# Patient Record
Sex: Male | Born: 1935 | Race: White | Hispanic: No | Smoking: Former smoker
Health system: Southern US, Community
[De-identification: ages and names within clinical notes are randomized; demographics above are authoritative.]

## PROBLEM LIST (undated history)

## (undated) DIAGNOSIS — I4891 Unspecified atrial fibrillation: Secondary | ICD-10-CM

## (undated) DIAGNOSIS — I4892 Unspecified atrial flutter: Secondary | ICD-10-CM

## (undated) DIAGNOSIS — C4490 Unspecified malignant neoplasm of skin, unspecified: Secondary | ICD-10-CM

## (undated) DIAGNOSIS — K219 Gastro-esophageal reflux disease without esophagitis: Secondary | ICD-10-CM

## (undated) DIAGNOSIS — M069 Rheumatoid arthritis, unspecified: Secondary | ICD-10-CM

## (undated) DIAGNOSIS — D509 Iron deficiency anemia, unspecified: Secondary | ICD-10-CM

## (undated) DIAGNOSIS — Z973 Presence of spectacles and contact lenses: Secondary | ICD-10-CM

## (undated) DIAGNOSIS — E039 Hypothyroidism, unspecified: Secondary | ICD-10-CM

## (undated) HISTORY — PX: APPENDECTOMY: SHX54

## (undated) HISTORY — DX: Hypothyroidism, unspecified: E03.9

## (undated) HISTORY — PX: COLON RESECTION: SHX5231

## (undated) HISTORY — DX: Gastro-esophageal reflux disease without esophagitis: K21.9

## (undated) HISTORY — DX: Rheumatoid arthritis, unspecified: M06.9

## (undated) HISTORY — PX: HIP SURGERY: SHX245

## (undated) HISTORY — PX: CHOLECYSTECTOMY: SHX55

## (undated) HISTORY — DX: Unspecified atrial fibrillation: I48.91

## (undated) HISTORY — DX: Unspecified atrial flutter: I48.92

## (undated) HISTORY — PX: LUMBAR DISC SURGERY: SHX700

## (undated) HISTORY — PX: SKIN SURGERY: SHX2413

## (undated) HISTORY — PX: TOTAL SHOULDER REPLACEMENT: SUR1217

---

## 2006-03-20 ENCOUNTER — Encounter: Admission: RE | Admit: 2006-03-20 | Discharge: 2006-03-20 | Payer: Self-pay

## 2006-05-04 ENCOUNTER — Encounter: Admission: RE | Admit: 2006-05-04 | Discharge: 2006-05-04 | Payer: Self-pay

## 2006-06-08 ENCOUNTER — Inpatient Hospital Stay (HOSPITAL_COMMUNITY): Admission: RE | Admit: 2006-06-08 | Discharge: 2006-06-09 | Payer: Self-pay | Admitting: Neurosurgery

## 2007-06-01 ENCOUNTER — Encounter: Admission: RE | Admit: 2007-06-01 | Discharge: 2007-06-01 | Payer: Self-pay | Admitting: Unknown Physician Specialty

## 2007-09-20 ENCOUNTER — Encounter: Payer: Self-pay | Admitting: Cardiology

## 2007-11-01 ENCOUNTER — Encounter: Payer: Self-pay | Admitting: Cardiology

## 2007-11-03 ENCOUNTER — Ambulatory Visit: Payer: Self-pay | Admitting: Cardiology

## 2007-11-04 ENCOUNTER — Encounter: Payer: Self-pay | Admitting: Cardiology

## 2007-11-22 ENCOUNTER — Ambulatory Visit: Payer: Self-pay | Admitting: Cardiology

## 2007-11-23 ENCOUNTER — Encounter: Payer: Self-pay | Admitting: Cardiology

## 2007-12-22 ENCOUNTER — Ambulatory Visit: Payer: Self-pay | Admitting: Internal Medicine

## 2008-01-06 ENCOUNTER — Encounter: Payer: Self-pay | Admitting: Internal Medicine

## 2008-01-12 ENCOUNTER — Ambulatory Visit (HOSPITAL_COMMUNITY): Admission: RE | Admit: 2008-01-12 | Discharge: 2008-01-13 | Payer: Self-pay | Admitting: Internal Medicine

## 2008-01-12 ENCOUNTER — Ambulatory Visit: Payer: Self-pay | Admitting: Internal Medicine

## 2008-02-07 ENCOUNTER — Ambulatory Visit: Payer: Self-pay | Admitting: Cardiology

## 2008-09-27 DIAGNOSIS — I4892 Unspecified atrial flutter: Secondary | ICD-10-CM

## 2008-09-28 ENCOUNTER — Ambulatory Visit: Payer: Self-pay | Admitting: Cardiology

## 2009-03-05 ENCOUNTER — Telehealth (INDEPENDENT_AMBULATORY_CARE_PROVIDER_SITE_OTHER): Payer: Self-pay | Admitting: *Deleted

## 2009-03-06 DIAGNOSIS — R002 Palpitations: Secondary | ICD-10-CM

## 2009-03-23 ENCOUNTER — Telehealth (INDEPENDENT_AMBULATORY_CARE_PROVIDER_SITE_OTHER): Payer: Self-pay | Admitting: *Deleted

## 2009-03-23 ENCOUNTER — Encounter: Payer: Self-pay | Admitting: Physician Assistant

## 2009-03-23 ENCOUNTER — Encounter: Payer: Self-pay | Admitting: Cardiology

## 2009-04-12 ENCOUNTER — Encounter: Payer: Self-pay | Admitting: Cardiology

## 2009-04-19 ENCOUNTER — Ambulatory Visit: Payer: Self-pay | Admitting: Cardiology

## 2009-04-19 DIAGNOSIS — I4891 Unspecified atrial fibrillation: Secondary | ICD-10-CM | POA: Insufficient documentation

## 2009-04-25 ENCOUNTER — Encounter (INDEPENDENT_AMBULATORY_CARE_PROVIDER_SITE_OTHER): Payer: Self-pay | Admitting: *Deleted

## 2009-04-26 ENCOUNTER — Encounter: Payer: Self-pay | Admitting: Cardiology

## 2009-06-11 ENCOUNTER — Encounter: Payer: Self-pay | Admitting: Cardiology

## 2009-06-25 ENCOUNTER — Ambulatory Visit (HOSPITAL_COMMUNITY): Admission: RE | Admit: 2009-06-25 | Discharge: 2009-06-25 | Payer: Self-pay | Admitting: Orthopedic Surgery

## 2009-06-25 ENCOUNTER — Encounter: Payer: Self-pay | Admitting: Cardiology

## 2009-06-28 ENCOUNTER — Encounter: Payer: Self-pay | Admitting: Cardiology

## 2009-06-29 ENCOUNTER — Encounter: Payer: Self-pay | Admitting: Cardiology

## 2009-07-12 ENCOUNTER — Encounter: Payer: Self-pay | Admitting: Cardiology

## 2009-08-10 ENCOUNTER — Encounter: Payer: Self-pay | Admitting: Cardiology

## 2009-08-24 ENCOUNTER — Encounter: Payer: Self-pay | Admitting: Cardiology

## 2009-09-20 ENCOUNTER — Ambulatory Visit: Payer: Self-pay | Admitting: Cardiology

## 2009-10-04 ENCOUNTER — Inpatient Hospital Stay (HOSPITAL_COMMUNITY): Admission: RE | Admit: 2009-10-04 | Discharge: 2009-10-05 | Payer: Self-pay | Admitting: Orthopedic Surgery

## 2010-01-29 ENCOUNTER — Encounter: Payer: Self-pay | Admitting: Cardiology

## 2010-05-07 NOTE — Letter (Signed)
Summary: Engineer, materials at [facility]  [address]   [place], [place] [address]   Phone: [phone]  Fax: [phone]        April 25, 2009 MRN: [id number]    [facility] [address] [place], [place]  [address]    Dear Mr. CRONKRIGHT,  Your test ordered by Selena Batten has been reviewed by your physician (or physician assistant) and was found to be stable. Your physician (or physician assistant) felt no changes were needed at this time.  ____ Echocardiogram  ____ Cardiac Stress Test  ____ Lab Work  ____ Peripheral vascular study of arms, legs or neck  ____ CT scan or X-ray  ____ Lung or Breathing test  __X__ Other: Cardionet Monitor (Heart Monitor)   Thank you.   Cyril Loosen, RN, BSN    Duane Boston, M.D., F.A.C.C. Thressa Sheller, M.D., F.A.C.C. Oneal Grout, M.D., F.A.C.C. Cheree Ditto, M.D., F.A.C.C. Daiva Nakayama, M.D., F.A.C.C. Kenney Houseman, M.D., F.A.C.C. Jeanne Ivan, PA-C

## 2010-05-07 NOTE — Letter (Signed)
Summary: External Correspondence/ OFFICE VISIT DR. DANIEL  External Correspondence/ OFFICE VISIT DR. DANIEL   Imported By: Dorise Hiss 02/05/2010 08:28:20  _____________________________________________________________________  External Attachment:    Type:   Image     Comment:   External Document

## 2010-05-07 NOTE — Letter (Signed)
Summary: External Correspondence/ OFFICE VISIT [facility]  External Correspondence/ OFFICE VISIT [facility]   Imported By: Dorise Hiss 08/29/2009 12:30:58  _____________________________________________________________________  External Attachment:    Type:   Image     Comment:   External Document

## 2010-05-07 NOTE — Procedures (Signed)
Summary: Cardionet Monitor-Final Summary  Cardionet Monitor-Final Summary   Imported By: Cyril Loosen, RN, BSN 04/25/2009 17:01:42  _____________________________________________________________________  External Attachment:    Type:   Image     Comment:   External Document  Appended Document: Cardionet Monitor-Final Summary Pt notified of results by letter.

## 2010-05-07 NOTE — Assessment & Plan Note (Signed)
Summary: EST-PT IRREGULAR HEARTBEAT   Visit Type:  Follow-up Primary Provider:  Dr. Donzetta Sprung   History of Present Illness: 76 year old male presents for a followup visit. He called complaining of recurrent palpitations and was placed on metoprolol 25 mg twice daily. We also provided a CardioNet monitor to better document objectively if he was having definitive rhythm changes in light of his previous ablation. This monitor did reveal evidence of recurrent atrial flutter with some rapid paroxysmal atrial fibrillation as well. These were self-limited.  Dennis Adams indicates feeling much better on metoprolol. I reviewed the monitor strips with him. At this point he is most comfortable with the present regimen. In the past he was on amiodarone. His CHADS2 score is 0-1.    Current Medications (verified): 1)  Metoprolol Tartrate 25 Mg Tabs (Metoprolol Tartrate) .... Take 1 Tablet By Mouth Two Times A Day 2)  Aspirin 325 Mg Tabs (Aspirin) .... Take 1 Tablet By Mouth Once A Day 3)  Calcium 500 Mg Tabs (Calcium Carbonate) .... Take 4 Tablet By Mouth Once A Day 4)  Centrum Silver  Tabs (Multiple Vitamins-Minerals) .... Take 1 Tablet By Mouth Once A Day 5)  Red Yeast Rice 600 Mg Tabs (Red Yeast Rice Extract) .... Take 1 Tablet By Mouth Once A Day 6)  Folic Acid 800 Mcg Tabs (Folic Acid) .... Take 1 Tablet By Mouth Once A Day 7)  Vitamin B-12 1000 Mcg Tabs (Cyanocobalamin) .... Take 1 Tablet By Mouth Once A Day 8)  Glucosamine 500 Mg Caps (Glucosamine Sulfate) .... Take 1 Tablet By Mouth Once A Day 9)  Prednisone 5 Mg Tabs (Prednisone) .... Take 1&1/2 Tablet By Mouth Once A Day 10)  Methotrexate 2.5 Mg Tabs (Methotrexate Sodium) .... Use As Directed 11)  Levothroid 25 Mcg Tabs (Levothyroxine Sodium) .... Take 1 Tablet By Mouth Once A Day 12)  Fish Oil 1200 Mg Caps (Omega-3 Fatty Acids) .... Take 2 Tablet By Mouth Once A Day 13)  Hydrocodone-Acetaminophen 5-500 Mg Tabs (Hydrocodone-Acetaminophen)  .... As Needed 14)  Leflunomide 10 Mg Tabs (Leflunomide) .... Take 1 Tablet By Mouth Once A Day  Allergies (verified): 1)  ! Celebrex  Comments:  Nurse/Medical Assistant: The patient's medications and allergies were reviewed with the patient and were updated in the Medication and Allergy Lists. Stated that meds are still the same and vebally gave names of added meds that were updated today.  Past History:  Past Medical History: Last updated: 04/18/2009 Rheumatoid arthritis G E R D Hypothyroidism Atrial Flutter - RFA 10/09 (Dr. Graciela Husbands)  Social History: Last updated: 04/18/2009 Retired  Married  Tobacco Use - No Alcohol Use - no Drug Use - no  Review of Systems  The patient denies anorexia, fever, chest pain, syncope, peripheral edema, prolonged cough, headaches, melena, and hematochezia.         Otherwise reviewed and negative.  Vital Signs:  Patient profile:   77 year old male Height:      73 inches Weight:      178 pounds BMI:     23.57 Pulse rate:   71 / minute BP sitting:   123 / 74  (left arm) Cuff size:   regular  Vitals Entered By: Dennis Adams (April 19, 2009 10:03 AM)  Physical Exam  Additional Exam:  Normally nourished appearing male in no acute distress. HEENT: Conjunctiva and lids normal, oropharynx clear. Neck: Supple, no elevated venous pressure or bruits. Lungs: Clear to auscultation, nonlabored. Cardiac: Regular rate and rhythm,  2/6 systolic murmur at the right base. No S3. Abdomen: Soft, nontender, no bruits. Extremities: No pitting edema, distal pulses 2+. Skin: Warm and dry. Muscular skeletal: No kyphosis. Neuropsychiatric: Alert and oriented x3, affect appropriate.   EKG  Procedure date:  04/19/2009  Findings:      Normal sinus rhythm at 65 beats per minute.  Impression & Recommendations:  Problem # 1:  ATRIAL FLUTTER (ICD-427.32)  Recurrent palpitations status post previous radiofrequency ablation by Dr. Graciela Husbands. Cardionet  monitor does document recurrent episodes of atrial flutter, self-limited. Addition of metoprolol has provided much better symptom control, and the patient is stable at this point. His CHADS2 score is 0-1, and aspirin will be continued. I will see him back in 6 months.  His updated medication list for this problem includes:    Metoprolol Tartrate 25 Mg Tabs (Metoprolol tartrate) .Marland Kitchen... Take 1 tablet by mouth two times a day    Aspirin 325 Mg Tabs (Aspirin) .Marland Kitchen... Take 1 tablet by mouth once a day  Orders: EKG w/ Interpretation (93000)  Problem # 2:  ATRIAL FIBRILLATION (ICD-427.31)  Brief episodes of atrial fibrillation also noted. Plan as above.  His updated medication list for this problem includes:    Metoprolol Tartrate 25 Mg Tabs (Metoprolol tartrate) .Marland Kitchen... Take 1 tablet by mouth two times a day    Aspirin 325 Mg Tabs (Aspirin) .Marland Kitchen... Take 1 tablet by mouth once a day  Patient Instructions: 1)  Your physician wants you to follow-up in: 6 months. You will receive a reminder letter in the mail one-two months in advance. If you don't receive a letter, please call our office to schedule the follow-up appointment. 2)  Your physician recommends that you continue on your current medications as directed. Please refer to the Current Medication list given to you today.

## 2010-05-07 NOTE — Assessment & Plan Note (Signed)
Summary: 1 YR FU -RECV REMINDER VS   Visit Type:  Follow-up Primary Provider:  Dr. Donzetta Sprung   History of Present Illness: 76 year old male presents for followup. He reports no problems with palpitations, tolerating metoprolol quite well. He denies any exertional chest pain or unusual shortness of breath.  He states that he is nearing time for left shoulder replacement surgery later this month. This has been delayed related to an anemia workup discussed below.  He saw Dr. Reuel Boom in May with findings of iron deficiency anemia, also being followed by Dr. Karilyn Cota.  Hemoglobin was 10 on 6 May with iron 25. He reportedly underwent upper and lower endoscopy as well as a capsule study without obvious findings of acute bleeding source. He was treated with packed red cells and iron supplements, reporting improvement in his hemoglobin to "11.5."   Preventive Screening-Counseling & Management  Alcohol-Tobacco     Smoking Status: quit     Year Quit: 1968     Cans of tobacco/week: chew 1pk tobacco/wk   Current Medications (verified): 1)  Metoprolol Tartrate 25 Mg Tabs (Metoprolol Tartrate) .... Take 1 Tablet By Mouth Two Times A Day 2)  Calcium 500 Mg Tabs (Calcium Carbonate) .... Take 2 Tablet By Mouth Once A Day 3)  Centrum Silver  Tabs (Multiple Vitamins-Minerals) .... Take 1 Tablet By Mouth Once A Day 4)  Red Yeast Rice 600 Mg Tabs (Red Yeast Rice Extract) .... Take 1 Tablet By Mouth Once A Day 5)  Folic Acid 800 Mcg Tabs (Folic Acid) .... Take 1 Tablet By Mouth Once A Day 6)  Vitamin B-12 1000 Mcg Tabs (Cyanocobalamin) .... Take 1 Tablet By Mouth Once A Day 7)  Glucosamine 500 Mg Caps (Glucosamine Sulfate) .... Take 1 Tablet By Mouth Once A Day 8)  Prednisone 5 Mg Tabs (Prednisone) .... Take 1&1/2 Tablet By Mouth Once A Day 9)  Methotrexate 2.5 Mg Tabs (Methotrexate Sodium) .... Use As Directed 10)  Levothyroxine Sodium 50 Mcg Tabs (Levothyroxine Sodium) .... Take 1 Tablet By Mouth Once A  Day 11)  Fish Oil 1200 Mg Caps (Omega-3 Fatty Acids) .... Take 2 Tablet By Mouth Once A Day 12)  Hydrocodone-Acetaminophen 5-500 Mg Tabs (Hydrocodone-Acetaminophen) .... As Needed 13)  Leflunomide 10 Mg Tabs (Leflunomide) .... Take 1 Tablet By Mouth Once A Day 14)  Nu-Iron 150 Mg Caps (Polysaccharide Iron Complex) .... Take 1 Tablet By Mouth Two Times A Day 15)  Tamsulosin Hcl 0.4 Mg Caps (Tamsulosin Hcl) .... Take 1 Tablet By Mouth Once A Day  Allergies (verified): 1)  ! Celebrex  Comments:  Nurse/Medical Assistant: The patient's medication list and allergies were reviewed with the patient and were updated in the Medication and Allergy Lists.  Past History:  Social History: Last updated: 04/18/2009 Retired  Married  Tobacco Use - No Alcohol Use - no Drug Use - no  Past Medical History: Rheumatoid arthritis G E R D Hypothyroidism Atrial Flutter - RFA 10/09 (Dr. Graciela Husbands) Atrial Fibrillation - paroxysmal, CHADS2 score 0-1  Social History: Smoking Status:  quit Cans of tobacco/week:  chew 1pk tobacco/wk   Review of Systems  The patient denies anorexia, fever, chest pain, syncope, dyspnea on exertion, abdominal pain, and severe indigestion/heartburn.         Otherwise reviewed and negative.  Vital Signs:  Patient profile:   76 year old male Height:      73 inches Weight:      170 pounds Pulse rate:  87 / minute BP sitting:   151 / 82  (left arm) Cuff size:   regular  Vitals Entered By: Carlye Grippe (September 20, 2009 3:14 PM)  Physical Exam  Additional Exam:  Normally nourished appearing male in no acute distress. HEENT: Conjunctiva and lids normal, oropharynx clear. Neck: Supple, no elevated venous pressure or bruits. Lungs: Clear to auscultation, nonlabored. Cardiac: Regular rate and rhythm, 2/6 systolic murmur at the right base. No S3. Abdomen: Soft, nontender, no bruits. Extremities: No pitting edema, distal pulses 2+. Skin: Warm and dry. Muscular skeletal:  No kyphosis. Neuropsychiatric: Alert and oriented x3, affect appropriate.   EKG  Procedure date:  09/20/2009  Findings:      Sinus rhythm at 77 beats per minute with nonspecific ST-T changes.  Impression & Recommendations:  Problem # 1:  ATRIAL FIBRILLATION (ICD-427.31)  Brief episodes, paroxysmal, and well-controlled with no palpitations on metoprolol. CHADS2 score is 0-1. He is not on Coumadin, and at this point no aspirin with concurrent history of iron deficiency anemia as noted above. No specific changes are made today. He will continue to see Dr. Reuel Boom on a regular basis, and we will continue annual followup visits, sooner if needed.  The following medications were removed from the medication list:    Aspirin 325 Mg Tabs (Aspirin) .Marland Kitchen... Take 1 tablet by mouth once a day His updated medication list for this problem includes:    Metoprolol Tartrate 25 Mg Tabs (Metoprolol tartrate) .Marland Kitchen... Take 1 tablet by mouth two times a day  Orders: EKG w/ Interpretation (93000)  Problem # 2:  ATRIAL FLUTTER (ICD-427.32)  Some documentation of brief recurrences, although well controlled symptomatically, following radiofrequency ablation by Dr. Graciela Husbands in October 2009.  The following medications were removed from the medication list:    Aspirin 325 Mg Tabs (Aspirin) .Marland Kitchen... Take 1 tablet by mouth once a day His updated medication list for this problem includes:    Metoprolol Tartrate 25 Mg Tabs (Metoprolol tartrate) .Marland Kitchen... Take 1 tablet by mouth two times a day  Patient Instructions: 1)  Your physician wants you to follow-up in: 1 year. You will receive a reminder letter in the mail one-two months in advance. If you don't receive a letter, please call our office to schedule the follow-up appointment. 2)  Your physician recommends that you continue on your current medications as directed. Please refer to the Current Medication list given to you today.

## 2010-05-07 NOTE — Letter (Signed)
Summary: Cardionet request for records  Cardionet request for records   Imported By: Cyril Loosen, RN, BSN 06/12/2009 10:13:45  _____________________________________________________________________  External Attachment:    Type:   Image     Comment:   External Document  Appended Document: Cardionet request for records Reviewed.  See last office note - it discusses indications and findings.  Appended Document: Cardionet request for records last office note and phone note faxed.

## 2010-06-23 LAB — COMPREHENSIVE METABOLIC PANEL
ALT: 13 U/L (ref 0–53)
Albumin: 3.3 g/dL — ABNORMAL LOW (ref 3.5–5.2)
Alkaline Phosphatase: 81 U/L (ref 39–117)
BUN: 12 mg/dL (ref 6–23)
Chloride: 104 mEq/L (ref 96–112)
GFR calc Af Amer: >60 mL/min (ref >60)
GFR calc non Af Amer: >60 mL/min (ref >60)
Glucose, Bld: 102 mg/dL — ABNORMAL HIGH (ref 70–99)
Total Bilirubin: 0.5 mg/dL (ref 0.3–1.2)
Total Protein: 6.2 g/dL (ref 6.0–8.3)

## 2010-06-23 LAB — URINALYSIS, ROUTINE W REFLEX MICROSCOPIC
Bilirubin Urine: NEGATIVE
Hgb urine dipstick: NEGATIVE
Ketones, ur: NEGATIVE mg/dL
Protein, ur: NEGATIVE mg/dL
Specific Gravity, Urine: 1.019 (ref 1.005–1.030)
pH: 5.5 (ref 5.0–8.0)

## 2010-06-23 LAB — PROTIME-INR: INR: 1.05 (ref 0.00–1.49)

## 2010-06-23 LAB — CBC
MCV: 91.4 fL (ref 78.0–100.0)
Platelets: 215 10*3/uL (ref 150–400)
RBC: 4.03 MIL/uL — ABNORMAL LOW (ref 4.22–5.81)

## 2010-06-23 LAB — SURGICAL PCR SCREEN: MRSA, PCR: NEGATIVE

## 2010-06-23 LAB — APTT: aPTT: 28 seconds (ref 24–37)

## 2010-06-30 LAB — COMPREHENSIVE METABOLIC PANEL
Albumin: 3.2 g/dL — ABNORMAL LOW (ref 3.5–5.2)
Alkaline Phosphatase: 82 U/L (ref 39–117)
BUN: 10 mg/dL (ref 6–23)
Calcium: 8.4 mg/dL (ref 8.4–10.5)
Creatinine, Ser: 1.03 mg/dL (ref 0.4–1.5)
Glucose, Bld: 89 mg/dL (ref 70–99)
Total Bilirubin: 0.9 mg/dL (ref 0.3–1.2)
Total Protein: 6.3 g/dL (ref 6.0–8.3)

## 2010-06-30 LAB — APTT: aPTT: 34 seconds (ref 24–37)

## 2010-06-30 LAB — URINALYSIS, ROUTINE W REFLEX MICROSCOPIC
Glucose, UA: NEGATIVE mg/dL
Hgb urine dipstick: NEGATIVE
Ketones, ur: NEGATIVE mg/dL
Nitrite: NEGATIVE
Specific Gravity, Urine: 1.022 (ref 1.005–1.030)
pH: 5.5 (ref 5.0–8.0)

## 2010-06-30 LAB — CBC
HCT: 26.4 % — ABNORMAL LOW (ref 39.0–52.0)
Hemoglobin: 8 g/dL — ABNORMAL LOW (ref 13.0–17.0)
MCHC: 30.5 g/dL (ref 30.0–36.0)
MCV: 81.6 fL (ref 78.0–100.0)
Platelets: 272 10*3/uL (ref 150–400)
RBC: 3.24 MIL/uL — ABNORMAL LOW (ref 4.22–5.81)
RDW: 25.3 % — ABNORMAL HIGH (ref 11.5–15.5)

## 2010-06-30 LAB — PROTIME-INR: INR: 1.11 (ref 0.00–1.49)

## 2010-07-01 ENCOUNTER — Other Ambulatory Visit: Payer: Self-pay | Admitting: *Deleted

## 2010-07-01 DIAGNOSIS — I4891 Unspecified atrial fibrillation: Secondary | ICD-10-CM

## 2010-07-01 MED ORDER — METOPROLOL TARTRATE 25 MG PO TABS: 25.0000 mg | ORAL_TABLET | Freq: Two times a day (BID) | ORAL | Status: DC

## 2010-07-02 ENCOUNTER — Other Ambulatory Visit: Payer: Self-pay | Admitting: Cardiology

## 2010-07-02 DIAGNOSIS — I4891 Unspecified atrial fibrillation: Secondary | ICD-10-CM

## 2010-07-04 NOTE — Telephone Encounter (Signed)
Dennis Adams 

## 2010-08-20 NOTE — Letter (Signed)
December 22, 2007    Madolyn Frieze. Jens Som, MD, [facility]  [address]  [place], [place] [address]   RE:  Dennis Adams, Dennis Adams  MRN:  [id number]  /  DOB:  04-20-1934   Dear Dennis Adams,   I had the pleasure of seeing Dennis Adams today in consultation at your  request because of his atrial flutter.   As you know, he is a gentleman whose past medical history is notable  mostly for rheumatoid arthritis, who presented with atrial flutter,  which he thinks was related to the green tea that he was drinking.  He  converted spontaneously over time and was referred now for consideration  of catheter ablation.   His CHADS score is 0, although he has a history of borderline diabetes.   PAST MEDICAL HISTORY:  Notable for rheumatoid for which he takes  methotrexate and it is otherwise broadly negative.  He does have a  history of peptic ulcer disease and GE reflux disease.   He also has a recent diagnosis of hypothyroidism for which he has been  started on therapy.   PAST SURGICAL HISTORY:  Notable for back surgery x3, cholecystectomy,  and diverticulosis surgery.   CURRENT MEDICATIONS INCLUDE:  1. Prednisone 7 mg a day.  2. Methotrexate 2.5 a week.  3. Levothyroxine 50 mcg a day.  4. He was previously on amiodarone, but that has been discontinued.   He has no known drug allergies.   He is married.  He has 2 children.  He does not use cigarettes, alcohol,  or recreational drugs.   PHYSICAL EXAMINATION:  GENERAL:  He is an elderly gentleman appearing  his stated age of 79.  VITAL SIGNS:  His blood pressure was recorded, but I have misplaced it.  His pulse was 60.  HEENT:  Demonstrated no icterus or xanthoma.  NECK:  Veins were flat.  The carotids were brisk and full bilaterally  without bruits.  I should note that he was in no acute distress.  LUNGS:  Clear.  HEART:  Sounds were regular.  EXTREMITIES:  His femoral pulses were 2+.  Distal pulses were intact.  There was no clubbing, cyanosis or edema.  NEUROLOGIC:  Grossly normal.   Electrocardiogram from [facility] demonstrated atrial flutter  that was typical with 2:1 conduction at a rate of 150 and sinus rhythm  that was quite bradycardic at a rate of 50 with intervals of  0.14/0.08/0.40.   Electrocardiogram today similarly demonstrated a modest bradycardia at a  rate of 60.   IMPRESSION:  1. Paroxysmal atrial flutter, currently being managed with rate      control and amiodarone.  2. CHADS score of 0.  3. Recent identification of hypothyroidism with a Levothyroxine dose,      which may be in fact as low as 8 mcg, but it is at 50 as above.  4. Rheumatoid arthritis, on immunotherapy.   Dennis Adams has atrial flutter, Dennis Adams, and a CHADS score of 0.  This is  associated in the predictions of a stroke risk of about 1.8%.  If we  assume that there is some modification for atrial flutter and that the  CHADS prediction stroke risks are high by even 50%, it is still  reasonable with a 0.5-1% risk of annualized stroke, which is 5-10 times  annually the risk of a flutter ablation.  I have reviewed this data with  him.  He understands this and would like to proceed.  We also  discussed the potential issue of heart block and we will proceed as  scheduled at which point in time we will discontinue his amiodarone and  his other medications.   Thank you for the consultation.    Sincerely,      Duke Salvia, MD, [facility]  Electronically Signed    SCK/MedQ  DD: 12/22/2007  DT: 12/23/2007  Job #: [id number]   CC:    Donzetta Sprung

## 2010-08-20 NOTE — Op Note (Signed)
NAME:  Dennis Adams, Dennis Adams NO.:  [id number]   MEDICAL RECORD NO.:  [id number]          PATIENT TYPE:  OIB   LOCATION:  2924                         FACILITY:  [facility]   PHYSICIAN:  Duke Salvia, MD, FACCDATE OF BIRTH:  09/23/1934   DATE OF PROCEDURE:  01/12/2008  DATE OF DISCHARGE:                               OPERATIVE REPORT   PREOPERATIVE DIAGNOSIS:  Atrial flutter.   POSTOPERATIVE DIAGNOSES:  Atrial flutter and atrial fibrillation.   PROCEDURES:  Invasive electrophysiological study, electroanatomical  mapping, radiofrequency catheter ablation, and ibutilide-facilitated  cardioversion.   Following obtaining informed consent, the patient was brought to the  Electrophysiology Laboratory and placed on the fluoroscopic table in  supine position.  After routine prep and drape, cardiac catheterization  was performed with local anesthesia and conscious sedation.  Noninvasive  blood pressure monitoring, transcutaneous oxygen saturation monitoring,  and end-tidal CO2 monitoring were performed continuously throughout the  procedure.  Following the procedure, the catheters were removed.  Hemostasis was obtained, and the patient was transferred to holding area  in stable condition.   CATHETERS:  1. A 6-French octapolar catheter was inserted via the right femoral      vein to the coronary sinus.  2. A 7-French duodecapolar catheter was inserted via left femoral vein      to tricuspid annulus.  3. An 8-French, 8-mm ThermoCool Navi-Star catheter was inserted to the      right femoral vein to mapping sites in the right atrium and      posterior septal space.   Surface leads I, aVF, and V1 were monitored continuously throughout the  procedure.  Following insertion of the catheters, a stimulation protocol  included:  1. Incremental atrial pacing.  2. Incremental ventricular pacing.  3. Single atrial extrastimuli of the atrium.   RESULTS:  End-tidal surface  electrocardiogram:  Initial:  Rhythm:  Sinus; RR interval 247 milliseconds; PR interval:  161  milliseconds; QRS duration 110 milliseconds; QT interval 475  milliseconds; P-wave duration 147 milliseconds; bundle-branch block:  Absent; pre-excitation:  Absent.   AH interval 67 milliseconds; HV interval 49 milliseconds.   Final:  Rhythm:  Sinus; RR interval 103 milliseconds; PR interval:  156  milliseconds; QRS duration 105 milliseconds; QT interval 550  milliseconds; P-wave duration 128 milliseconds; bundle-branch block:  Absent.   AV nodal function.  AV Wenckebach was actually not assessed.   AV nodal conduction, however, was decremental with AV nodal ERP at 600  milliseconds. va conduction was dissociated at 600 milliseconds.   ACCESSORY PATHWAY FUNCTION:  No evidence of accessory pathway was  identified.   ARRHYTHMIAS INDUCED:  Atrial flutter was induced with coronary sinus  pacing.  Typical atrial flutter ensued at elective anatomical activation  mapping confirmed counterclockwise rotation around the tricuspid  annulus.  The patient then unfortunately went into, following  spontaneous termination of flutter, atrial fibrillation.  This started  and stopped a couple of times and then persisted.  Because of its  persistence and it is having recurred, we gave the patient ibutilide 1  mg over 10 minutes and  about 8 minutes later, the patient converted to  sinus rhythm.   At this point, electroanatomical-guided flutter ablation was undertaken.  A single series of lesions were delivered across the cavotricuspid  isthmus under electroanatomical guidance.  The A1 and A2 went out to 150  milliseconds.  Bidirectional conduction block was demonstrated via the  electroanatomical map in addition to electrocardiograms.   IMPRESSION:  1. Normal sinus function - with notable bradycardia.  2. Abnormal atrial function manifested by history of sustained      inducible atrial flutter but also  atrial fibrillation, which      required ibutilide for termination.  3. Normal atrioventricular nodal function.  4. Normal His-Purkinje system function.  5. No accessory pathway.  6. Normal ventricular response to programmed stimulation.   SUMMARY CONCLUSION:  Results of electrophysiological testing  demonstrated atrial flutter being isthmus dependent.  The substrate was  successfully eliminated with RF energy delivered via CARTO ablation  system.  The patient also, however, developed atrial fibrillation which  is associated with the likelihood of atrial fibrillation manifested  itself down the road.  In an event this were to occur, anticoagulation  therapy will need to be considered.   We will plan at this point to stop the patient's amiodarone, however,  given the bradycardia.   The patient will need to be assessed for chronotropic incompetence  following drug washout.      Duke Salvia, MD, [facility]  Electronically Signed     SCK/MEDQ  D:  01/12/2008  T:  01/13/2008  Job:  [id number]   cc:   Celene Skeen [facility]

## 2010-08-20 NOTE — Assessment & Plan Note (Signed)
[facility]                          [facility]   NILTON, LAVE                        MRN:          [id number]  DATE:09/28/2008                            DOB:          12-06-1934    PRIMARY CARE PHYSICIAN:  Donzetta Sprung, MD   REASON FOR VISIT:  Routine followup.   HISTORY OF PRESENT ILLNESS:  I saw Mr. Kam back in November 2009.  He  is status post radiofrequency ablation of typical atrial flutter by Dr.  Graciela Husbands, back in October of last year.  His CHADS2 score is 0-1 and he  continues on aspirin with otherwise no need for any rate controlling  medications.  He has had evidence of sinus bradycardia, which is also  evident today by electrocardiogram.  His heart rate is around 50 at rest  with sinus rhythm and a rare premature atrial complex.  Otherwise, no  marked changes are noted in comparison to the prior tracing and his PR  interval has not prolonged.  He denies any lightheadedness, dizziness,  or syncope, and has had no palpitations since we last saw him.   ALLERGIES:  CELEBREX.   MEDICATIONS:  1. Calcium 2000 mg p.o. daily.  2. Centrum Silver daily.  3. Red yeast rice extract 600 mg p.o. daily.  4. Folic acid 800 mcg p.o. daily.  5. Vitamin B12 1000 mcg p.o. daily.  6. Glucosamine daily.  7. Prednisone 7.5 mg p.o. daily.  8. Methotrexate as directed.  9. Levothyroxine 25 mcg p.o. daily.  10.Omega-3 supplements 1200 mg 2 tablets p.o. daily.  11.Aspirin 81 mg p.o. daily.  12.Hydrocodone 5/500 mg p.r.n.   REVIEW OF SYSTEMS:  Mr. Gubbels states that he has had some skin cancers  removed and also has occasional flares of his rheumatoid arthritis.  Otherwise, no new complaints.  Systems reviewed and negative otherwise.   PHYSICAL EXAMINATION:  VITAL SIGNS:  Blood pressure 137/74, heart rate  is 50 and regular, weight is 184 pounds, which is stable.  GENERAL:  The patient is comfortable and in no acute distress.  HEENT:  Conjunctivae and lids normal.  Oropharynx clear.  NECK:  Supple.  No elevated jugular venous pressure.  No loud bruits.  No thyromegaly.  LUNGS:  Clear without labored breathing.  CARDIAC:  Regular rate and rhythm.  No pathologic systolic murmur or  pericardial rub.  No S3 gallop.  ABDOMEN:  Soft, nontender.  Bowel sounds are present.  EXTREMITIES:  No pitting edema.  Distal pulses are 2+.  SKIN:  Warm and dry.  MUSCULOSKELETAL:  No kyphosis noted.  NEUROPSYCHIATRIC:  The patient is alert, oriented, and affect is  appropriate   IMPRESSION AND RECOMMENDATIONS:  History of typical atrial flutter,  status post radiofrequency ablation in October of last year.  CHADS2  score is 0-1, and we will plan to continue aspirin alone at this point.  Heart rhythm at rest is sinus bradycardia which does not seem to be  symptom provoking at this point.  Depending on how Mr. Dubey feels  clinically, we may eventually need to consider  a standard treadmill test  to assess for potential chronotropic incompetence, although he does not  seem to be limited at this time in discussing the matter with him.  Otherwise, I will plan to see him back over the next year.  He has not  manifested any palpitations to suggest paroxysmal atrial fibrillation.     Jonelle Sidle, MD  Electronically Signed    SGM/MedQ  DD: 09/28/2008  DT: 09/28/2008  Job #: [id number]   cc:   Donzetta Sprung

## 2010-08-20 NOTE — Discharge Summary (Signed)
NAME:  Dennis Adams, Dennis Adams NO.:  [id number]   MEDICAL RECORD NO.:  [id number]          PATIENT TYPE:  OIB   LOCATION:  2924                         FACILITY:  [facility]   PHYSICIAN:  Duke Salvia, MD, FACCDATE OF BIRTH:  04-20-1934   DATE OF ADMISSION:  01/12/2008  DATE OF DISCHARGE:  01/13/2008                               DISCHARGE SUMMARY   FINAL DIAGNOSES:  1. Discharging day #1 status post electrophysiology study      radiofrequency catheter ablation of a counterclockwise caval      tricuspid isthmus dependent atrial flutter, bidirectional block,      the CARTO system was utilized.  2. The patient is discharging on beta blocker as p.r.n. medication in      case atrial flutter recurs.  3. If atrial flutter recurs, the patient is asked to call Dr.      Ival Bible office in [place] for an outpatient monitor.  This is for      recurrent palpitations, he would have a event recorder.  4. The patient is to stop amiodarone therapy.   SECONDARY DIAGNOSES:  1. Rheumatoid arthritis.  2. Hypothyroidism, treated.  3. Peptic ulcer disease/gastroesophageal reflux disease.   This patient has no known drug allergies.   PROCEDURE:  January 12, 2008, electrophysiology study radiofrequency  catheter ablation of a typical counterclockwise isthmus dependent atrial  flutter, Dr. Sherryl Manges.   BRIEF HISTORY:  [name] is a 76 year old male.  He has presented with  atrial flutter.  He feels a fluttering in his chest and when he checks  his blood pressure machine, his heart rate has been seen to be greater  than 150 beats per minute.  He also has a history of hypothyroidism.  He  has been recently started on levothyroxine therapy.  The risks and  benefits of electrophysiology study radiofrequency catheter ablation  have been discussed with the patient, modifying atrial flutter reduces  stroke risk from 50% to about 1.8% yearly.  The patient wishes to  proceed with  electrophysiology study and possible ablation and this will  be done at the earliest possible convenience.   HOSPITAL COURSE:  The patient presents on January 12, 2008.  He underwent  electrophysiology study in the lab with CARTO system.  A caval tricuspid  isthmus dependent flutter was identified and ablated.  The patient had  no recurrence.   He discharged on the following medication.  1. Methotrexate 20 mg weekly.  2. Levothyroxine 50 mcg daily.  3. Prednisone 5 mg daily.  4. New medication, metoprolol 25 mg orally every 8 hours if his heart      starts fluttering again.   He is to call Dr. Ival Bible office if his heart does have recurrent  palpitations and he has an office visit with Dr. Diona Browner on Monday  February 07, 2008, at 10:45.  Lab studies were obtained on January 06, 2008.  Sodium is 141, potassium 3.9, chloride 105, carbonate 29, glucose  is 92, BUN is 18, creatinine is 1.  White cells 12.4, hemoglobin 13.1,  hematocrit 39.3, and platelets  of 299.      Maple Mirza, [place]      Duke Salvia, MD, [facility]  Electronically Signed    GM/MEDQ  D:  01/13/2008  T:  01/14/2008  Job:  [id number]   cc:   Cheyenne Adas, MD

## 2010-08-20 NOTE — Assessment & Plan Note (Signed)
[facility]                          [facility] NOTE   Dennis Adams, Dennis Adams                        MRN:          [id number]  DATE:02/07/2008                            DOB:          [birth year]    PRIMARY CARE PHYSICIAN:  Dr.  Donzetta Sprung.   REASON FOR VISIT:  Followup atrial flutter ablation.   HISTORY OF PRESENT ILLNESS:  I saw Dennis Adams during an inpatient  consultation with atrial flutter in the setting of small bowel  obstruction.  He had followup in the office in August with Dr. Jens Som  and was referred for consideration of an atrial flutter ablation.  Dr.  Graciela Husbands saw the patient and ultimately proceeded with an ablation in early  October.  He was discharged with a p.r.n. beta-blocker and has denied  having any problems with palpitations.  He is not on Coumadin with low  CHADS2 score of 0-1 and is also no longer taking amiodarone.  Electrocardiogram today shows sinus bradycardia with normal intervals  and nonspecific T-wave changes.  He had been taking a full-dose aspirin  daily.   ALLERGIES:  CELEBREX.   MEDICATIONS:  1. Calcium supplements 1000 mg 2 tablets p.o. daily.  2. Omega-3 supplements 1200 mg p.o. daily.  3. Centrum Silver daily.  4. Red yeast rice extract 600 mg daily.  5. Folic acid 800 mg p.o. daily.  6. Vitamin B12.  7. Glucosamine daily.  8. Prednisone 7.5 mg daily.  9. Methotrexate as directed.  10.Levothyroxine 50 mcg daily.   REVIEW OF SYSTEMS:  As per the history of present illness.  Otherwise  negative.   PHYSICAL EXAMINATION:  VITAL SIGNS:  Blood pressure today is 160/88,  heart rate is 77 and regular, and weight is 184 pounds.  GENERAL:  The patient is comfortable in no acute distress.  NECK:  No elevated jugular venous pressure.  No loud bruits.  LUNGS:  Clear.  CARDIOVASCULAR:  Irregular rate and rhythm without loud murmur or  gallop.  EXTREMITIES:  No pitting edema.   IMPRESSION AND  RECOMMENDATIONS:  Paroxysmal atrial flutter status post  successful radiofrequency ablation.  This is associated with normal left  ventricular systolic function and a CHADS2 score of 0-1.  He will  continue on aspirin at this point and has p.r.n. beta-blocker therapy  available, although has not experienced any significant palpitations.  I  agree with  discontinuation of amiodarone and will plan to see him back in 6 months,  possibly on an annual basis thereafter.     Jonelle Sidle, MD  Electronically Signed    SGM/MedQ  DD: 02/07/2008  DT: 02/08/2008  Job #: [id number]   cc:   Donzetta Sprung

## 2010-08-20 NOTE — Assessment & Plan Note (Signed)
[facility]                          [facility]   AUDLEY, HINOJOS                        MRN:          [id number]  DATE:11/22/2007                            DOB:          May 29, 1934    HISTORY OF PRESENT ILLNESS:  Mr. Pigeon is a 76 year old male who  returns for evaluation of atrial flutter.  The patient was recently  admitted to [facility] with a partial small bowel obstruction.  During that admission, he was found to be in atrial flutter, and was  seen by Dr. Diona Browner.  Note that the patient did have atrial flutter  prior to that admission and had been scheduled to see the cardiologist  for that issue prior to being admitted for his SBO.  At that time, Dr.  Diona Browner stopped his Cardizem and placed him on amiodarone to help  maintain sinus rhythm.  He also noted that his CHADS2 score was low, and  therefore, he has been treated with aspirin.  We arranged for him to be  seen today for discussion of potential atrial flutter ablation.  Note,  the patient did have an echocardiogram performed on November 03, 2007.  His  LV function was normal with an estimated ejection fraction of 55%-60%.  Left and right atrium were both mildly dilated.  There is trace mitral  and tricuspid regurgitation.  It is also noted that his TSH previously  was elevated at 10.01.  Since the patient was discharged, he has done  well.  He denies any dyspnea, chest pain, palpitations, or syncope.  There is no pedal edema.  His SBO has completely resolved.   MEDICATION:  1. Calcium.  2. Aspirin 325 mg p.o. daily.  3. Fish oil 1200 mg p.o. daily.  4. Multivitamin.  5. Red yeast rice.  6. Folate.  7. B12.  8. Glucosamine.  9. Prednisone.  10.Methotrexate.  11.Amiodarone 200 mg p.o. b.i.d.   PHYSICAL EXAMINATION:  VITAL SIGNS:  Today, shows a blood pressure of  134/75 and his pulse is 60.  He weighs 176.8 pounds.  HEENT:  Normal.  NECK:  Supple.  CHEST:  Clear.  CARDIOVASCULAR:  Regular rhythm.  ABDOMEN:  Shows no tenderness.  EXTREMITIES:  Show no edema.   His electrocardiogram shows a sinus rhythm at rate of 60.  The axis is  normal.  There are no significant ST changes.   DIAGNOSES:  1. Paroxysmal atrial flutter - Mr. Pitta is in sinus rhythm today.      His left ventricular function is normal.  Note, his TSH was      elevated previously.  We will plan to repeat his TSH as well as a      free T3 and T4.  He will continue on his amiodarone for now, but I      would decrease it to 200 mg p.o. daily.  His CHADS2 score is 0 (he      has had no previous CVA, no history of hypertension, age less than      36, and there is no history of diabetes).  However, there is still      a risk for an embolic event.  He will continue on his aspirin and I      will arrange for him to be seen by one of our electrophysiologists      for consideration of atrial flutter ablation.  Mr. Granada is in      agreement with this.  2. Recent increased TSH - we will plan to repeat his TSH as well as      free T3 and T4.  3. Recent partial small bowel obstruction - this has resolved.  4. History of rheumatoid arthritis - management per his primary care      physician.  5. History of hyperlipidemia.     Madolyn Frieze Jens Som, MD, [facility]  Electronically Signed    BSC/MedQ  DD: 11/22/2007  DT: 11/23/2007  Job #: [id number]   cc:   Donzetta Sprung

## 2010-08-23 NOTE — Op Note (Signed)
NAME:  Dennis Adams, PRY NO.:  [id number]   MEDICAL RECORD NO.:  [id number]          PATIENT TYPE:  INP   LOCATION:  2899                         FACILITY:  [facility]   PHYSICIAN:  Hewitt Shorts, M.D.DATE OF BIRTH:  [birth year]   DATE OF PROCEDURE:  06/08/2006  DATE OF DISCHARGE:                               OPERATIVE REPORT   SURGEON:  Hewitt Shorts, M.D.   ASSISTANTS:  Stefani Dama, M.D.  Russell L. Webb Silversmith, RN.   PREOPERATIVE DIAGNOSES:  Lumbar stenosis, lumbar rotatory scoliosis,  lumbar disk herniation, lumbar spondylosis and lumbar degenerative disk  disease.   POSTOPERATIVE DIAGNOSES:  Lumbar stenosis, lumbar rotatory scoliosis,  lumbar disk herniation, lumbar spondylosis and lumbar degenerative disk  disease.   PROCEDURES:  L3 and L4 and L4 and L5 lumbar laminectomy and facetectomy  and microdiskectomy with microdissection, and bilateral L3-4 and L4-5  posterior lumbar interbody fusion with interbody implants and Vitoss  with bone marrow aspirate, and bilateral L3 to L5 posterolateral  arthrodesis with 90D posterior instrumentation and Infuse and Vitoss  with bone marrow aspirate.   ANESTHESIA:  General endotracheal.   INDICATIONS:  The patient is a 76 year old male, who presented with low  back and left lumbar radicular pain.  He had a history of previous  lumbar surgeries.  The most recent was over 10 years ago.  He had  advanced degenerative changes of the L3-4 and L4-5 levels, and the  decision was made to proceed with decompression and arthrodesis.   DESCRIPTION OF PROCEDURES:  The patient was brought into the operating  room, placed under general endotracheal anesthesia.  The patient was  turned to the prone position and the lumbar region was prepped with  Betadine Soap and Solution and draped in a sterile fashion.  The midline  was infiltrated with local anesthetic with epinephrine, and a midline  incision was made and carried down  through the subcutaneous tissue.  Bipolar cautery and electrocautery were used to maintain hemostasis.  Dissection was carried down to the lumbar fascia, which was incised  bilaterally, and the paraspinal muscles were dissected off the spinous  processes and laminae in a subperiosteal fashion.  A self-retaining  retractor was placed and an x-ray was taken for localization.  The L3,  L4 and L5 spinous processes and lamina were identified, and then using  magnification, bilateral L3-4 and L4-5 laminotomies and facetectomies  were performed using the X-Max drill and Kerrison punches.  There were  scarring and what appeared to be previous laminotomy defects on the left  at L4-5, and on the right at L3-4.  We were able to dissect around the  scar tissue and noted markedly hypertrophic facets bilaterally at L3-4  and L4-5.  After facetectomies were performed, we were able to expose  the lateral epidural space and decompress the exiting nerve roots  bilaterally at the L3-4 and L4-5 levels.  We then were able to dissect  around the epidural space and identify the annulus of the disk space at  each level.  The annulus was incised bilaterally and the  disk space  entered.  We found marked disk space narrowing on the left at L3-4 and  on the right at L4-5, and this was carefully opened by proceeding with  the diskectomy on the opposite side.  And then using distractors to open  up the disk space, we were able to then enter from the more collapsed  side.  We were able to proceed with a through diskectomy bilaterally at  each level.  And then we prepared the endplates for arthrodesis,  removing the cartilaginous surfaces of the vertebral bodies, and then we  measured the height of the intervertebral disk space and selected 7-mm  implants for the L3-4 level and 8-mm implants for the L4-5 level.  We  then probed the left L4 pedicle and aspirated bone marrow aspirate from  the vertebral body, which was  injected over a 10-cc strip of Vitoss.  And then, each of the implants was packed with Vitoss with bone marrow  aspirate.  We then placed the first implant on the right side at L3-4,  packed the midline of the disk space with additional Vitoss with bone  marrow aspirate, and then placed the implant on the left side at L3-4.  And this was similarly done at the L4-5 level.  In each circumstance,  the thecal sac and nerve root were gently retracted to allow for  placement of each of the implants.  Each of the implants was  countersunk, and once all 4 implants were in place, the C-arm  fluoroscope was draped and brought into the field to provide guidance,  while we placed the pedicle-screw fixation.  Posterior instrumentation  was placed with screws bilaterally at L3, L4 and L5.  Each of the  pedicles was probed, examined with the ball probe, and no cutouts were  found.  Each of the pedicles were tapped with a 5.25-mm tap, and then  5.75-mm screws were placed bilaterally at L3, L4 and L5.  After each of  the pedicles were tapped, they were examined with the ball probe.  Again, no cutouts were found and good threading was noted.  Once all 6  screws were in place and appeared in good position from a lateral  perspective, an A/P view was taken.  Again, each of the screws had good  trapezoidal trajectories through the pedicles.  And then we selected 7-  mm prelordosed rods.  They were placed in the screw heads and secured  with locking caps.  Once all 6 locking caps were on, final tightening  was performed again with countertorque.  We had previously exposed the  transverse processes at L3, L4 and L5.  They were decorticated, and we  packed a layer of Infuse and then placed Vitoss with bone marrow  aspirate over the Infuse in the lateral gutters and over the transverse  processes of L3, L4 and L5.  The wound had been irrigated numerous times during the procedure with saline, and then subsequently  with Bacitracin  solution.  We again examined the spinal canal and it was felt that good  decompression had been achieved.  None of the bone graft material had  fallen into the canal.  We selected a medium cross-connector, and it was  placed between the L4 and L5 screws and was secured and tightened to the  rods.  And then we proceeded with closure.  The deep fascia was closed  with interrupted, undyed 1 Vicryl sutures.  The Scarpa fascia was closed  with  interrupted, inverted, undyed 1 Vicryl sutures, and the  subcutaneous and subcuticular layers were closed with interrupted,  inverted 2-0 undyed Vicryl sutures, and the skin edges were approximated  with Dermabond.  The wound was dressed with Adaptic and sterile gauze  and Hyperfix, and then the patient was turned to a supine position to be  reversed from the anesthetic, extubated and transferred to the recovery  room for further care.      Hewitt Shorts, M.D.  Electronically Signed     RWN/MEDQ  D:  06/08/2006  T:  06/08/2006  Job:  [id number]   cc:   Stefani Dama, M.D.

## 2010-08-23 NOTE — H&P (Signed)
NAME:  Dennis Adams, Dennis Adams NO.:  [id number]   MEDICAL RECORD NO.:  [id number]          PATIENT TYPE:  INP   LOCATION:  3035                         FACILITY:  [facility]   PHYSICIAN:  Hewitt Shorts, M.D.DATE OF BIRTH:  Mar 07, 1935   DATE OF ADMISSION:  06/08/2006  DATE OF DISCHARGE:                              HISTORY & PHYSICAL   HISTORY OF PRESENT ILLNESS:  The patient is a 76 year old right-handed  white male who is admitted for treatment of advanced lumbar spondylosis  degenerative disc disease with resulting rotatory scoliosis and canal  and neural foraminal stenosis.  The decision was made to proceed with  decompression and arthrodesis.  Symptomatically, he has been having pain  from the left side of his low back radiating down to the lateral aspect  of the left hip into the anterior left thigh to the knee, walking  aggravates the pain.  Ice seems to ease it.  He has a history of  rheumatoid arthritis for the past 26 years.  He has had two previous  lumbar surgeries in 1985 by Dr. Plumeritis and in 1997 by Dr. Leilani Merl.   The patient was treated with epidural steroid injections without relief.  He has been requiring narcotic pain medications.   The patient was evaluated with x-rays and MRI scan and is admitted now  for surgical decompression arthrodesis.   PAST MEDICAL HISTORY:  Notable for his history of rheumatoid arthritis  for the past 26 years, followed by Dr. Estill Bakes.  No history of  hypertension, myocardial infarction, cancer, stroke, diabetes, peptic  ulcer disease or lung disease.   PAST SURGICAL HISTORY:  Cholecystectomy in 1998, lumbar surgery by Dr.  Leilani Merl in 1997 and lumbar surgery by Dr. Plumeritis in 1985.   ALLERGIES:  He denies allergies to medications.   MEDICATIONS:  Methotrexate, Prednisone and folic acid.   FAMILY HISTORY:  Mother is in good health at age 68 with heart disease.  Father died of a myocardial infarction.   SOCIAL  HISTORY:  The patient is married, he is retired, he does not  smoke.  He does not have a history of substance abuse.   REVIEW OF SYSTEMS:  Notable as described in history of present illness  and past medical history.  His point review of systems sheet is  otherwise unremarkable.   PHYSICAL EXAMINATION:  GENERAL:  The patient is a well-developed, well-  nourished white male in no acute distress.  VITAL SIGNS:  Temperature 98.6, pulse 79, blood pressure 149/85,  respirations 18.  Height 5 foot 11 inches, weight 171 pounds.  LUNGS:  Clear to auscultation.  He has symmetric respiratory excursion.  HEART:  Mild tachycardia, no S1 or S2, there is no murmur.  ABDOMEN:  Soft, nondistended, bowel sounds present.  EXTREMITIES:  No cyanosis, clubbing or edema.  MUSCULOSKELETAL:  Mild discomfort on flexion and extension of the low  back.  He is able to flex 90 degrees, able to extend 120 degrees, mild  discomfort to palpation in the left paralumbar region and right  paralumbar region.  None over the  lumbar spinous process.  Straight leg  raising is negative bilaterally.  NEURO:  There is 5/5 strength to the upper and lower extremities  including the deltoids, triceps, biceps group, quadriceps, dorsalis  flexors and plantar flexors bilaterally.  Sensation is intact to pin  prick in the upper and lower extremities.  Reflexes are minimal in the  left biceps, absent in the right biceps, brachialis and triceps were  absent bilaterally.  The left quadriceps is minimal, the right is  absent.  The gastrocnemius are absent bilaterally.  Toes are down going  bilaterally.  He has a normal gait and stance.   IMPRESSION:  Patient with low back and left lumbar radicular pain.  He  has advanced degenerative changes at L3-4 and L4-5 with extensive  spondylosis and degenerative disc disease that is scoliosis.  There is  disc space collapse and facet arthropathy on the left at L3-4 and on the  right at L4-5.  The  stenosis of the spinal canal as well as the neural  foramina.   PLAN:  The patient will be admitted for L3-4 to L4-5 lumbar laminectomy,  facetectomy, microdiskectomy and posterior lumbar interbody fusion and  posterior lateral arthrodesis with interbody implants and posterior  instrumentation and bone graft.  We have discussed the nature of the  suture, alternatives to surgery, typical length of surgery, hospital  stay and overall rehabilitation and limitations postoperatively and the  need for postoperative mobilization and planned to use the cell saver  during such a surgery.  We discussed the risks of surgery which are  increased because of his longstanding treatment with methotrexate and  prednisone.  These complications and risks include the risk of wound  healing, infection, bleeding, possibility of transfusion, the risks of  neurologic dysfunction with pain, weakness, numbness and paresthesias,  again this is increased due to his previous lumbar surgeries and the  extensive arthritic changes seen.  We discussed the risk of dural tear,  leakage and possible need for further surgery.  The risk of failure of  the arthrodesis and possible need for further surgery and anesthetic  risks such as myocardial infarction, stroke or death.  I have discussed  all of this and he would like to go ahead with surgery and is admitted  for such.      Hewitt Shorts, M.D.  Electronically Signed     RWN/MEDQ  D:  06/08/2006  T:  06/08/2006  Job:  [id number]

## 2010-09-13 ENCOUNTER — Encounter: Payer: Self-pay | Admitting: Cardiology

## 2010-09-16 ENCOUNTER — Ambulatory Visit (INDEPENDENT_AMBULATORY_CARE_PROVIDER_SITE_OTHER): Payer: Medicare Other | Admitting: Cardiology

## 2010-09-16 ENCOUNTER — Encounter: Payer: Self-pay | Admitting: Cardiology

## 2010-09-16 VITALS — BP 107/72 | HR 88 | Ht 73.0 in | Wt 159.0 lb

## 2010-09-16 DIAGNOSIS — I4892 Unspecified atrial flutter: Secondary | ICD-10-CM

## 2010-09-16 DIAGNOSIS — I4891 Unspecified atrial fibrillation: Secondary | ICD-10-CM

## 2010-09-16 MED ORDER — ASPIRIN EC 81 MG PO TBEC: 81.0000 mg | DELAYED_RELEASE_TABLET | Freq: Every day | ORAL | Status: AC

## 2010-09-16 MED ORDER — METOPROLOL TARTRATE 50 MG PO TABS: 50.0000 mg | ORAL_TABLET | Freq: Two times a day (BID) | ORAL | Status: DC

## 2010-09-16 NOTE — Assessment & Plan Note (Signed)
Paroxysmal, present today, without active sense of palpitations or shortness of breath. As noted above, I have concerns about his candidacy for Coumadin with history of balance difficulties, ambulating with a cane, and intermittent falls. Plan therefore is to recommend aspirin daily as long as he tolerates it, and increase Lopressor to 50 mg p.o. B.i.d. He generally prefers conservative measures at this point, avoiding antiarrhythmics if possible. Continue regular followup with Dr. Reuel Boom.

## 2010-09-16 NOTE — Patient Instructions (Signed)
Follow up as scheduled. Increase Lopressor (metoprolol) to 50 mg two times a day. Start Coated Aspirin 81 mg daily.

## 2010-09-16 NOTE — Progress Notes (Signed)
Clinical Summary Dennis Adams is a 76 y.o.male presenting for followup. He was seen back in January. He reports no significant problems with palpitations or progressive shortness of breath. No chest pain. He has been under a lot of stress, caring for his wife with Alzheimer's dementia.  He reports chronic problems with balance. He does follow intermittently, without loss of consciousness. He uses a cane to ambulate. Also reports previous problem with anemia. Does not describe any melena or hematochezia.  Today he is noted to be in atrial fibrillation, although was not aware of this without any sense of palpitations. Heart rate comes down when he sits for a few minutes, although was elevated on his ECG after climbing up to the examination table. His CHADS2 score is 1. Today we discussed aspirin versus Coumadin as well as continuing titration of his beta blocker. After discussing the matter with him, I have concerns about his candidacy for Coumadin with balance difficulty and falls.   Allergies  Allergen Reactions  . Celecoxib     Current outpatient prescriptions:Calcium Carbonate (CALCIUM 500 PO), Take 2 tablets by mouth daily.  , Disp: , Rfl: ;  folic acid (FOLVITE) 800 MCG tablet, Take 800 mcg by mouth daily.  , Disp: , Rfl: ;  Glucosamine 500 MG CAPS, Take 1 capsule by mouth daily.  , Disp: , Rfl: ;  HYDROcodone-acetaminophen (VICODIN) 5-500 MG per tablet, Take 1 tablet by mouth every 6 (six) hours as needed.  , Disp: , Rfl:  iron polysaccharides (NU-IRON) 150 MG capsule, Take 150 mg by mouth 2 (two) times daily.  , Disp: , Rfl: ;  leflunomide (ARAVA) 10 MG tablet, Take 10 mg by mouth daily.  , Disp: , Rfl: ;  levothyroxine (SYNTHROID, LEVOTHROID) 50 MCG tablet, Take 50 mcg by mouth daily.  , Disp: , Rfl: ;  methotrexate (RHEUMATREX) 2.5 MG tablet, Take 2.5 mg by mouth as directed. Caution:Chemotherapy. Protect from light. , Disp: , Rfl:  Multiple Vitamins-Minerals (CENTRUM SILVER PO), Take 1 tablet  by mouth daily.  , Disp: , Rfl: ;  Omega-3 Fatty Acids (FISH OIL) 1200 MG CAPS, Take 2 capsules by mouth daily.  , Disp: , Rfl: ;  predniSONE (DELTASONE) 5 MG tablet, Take 7.5 mg by mouth daily.  , Disp: , Rfl: ;  Red Yeast Rice 600 MG TABS, Take 1 tablet by mouth daily.  , Disp: , Rfl: ;  Tamsulosin HCl (FLOMAX) 0.4 MG CAPS, Take 0.4 mg by mouth daily.  , Disp: , Rfl:  vitamin B-12 (CYANOCOBALAMIN) 1000 MCG tablet, Take 1,000 mcg by mouth daily.  , Disp: , Rfl: ;  DISCONTD: metoprolol tartrate (LOPRESSOR) 25 MG tablet, TAKE 1 TABLET TWICE DAILY, Disp: 60 tablet, Rfl: 6;  aspirin EC 81 MG tablet, Take 1 tablet (81 mg total) by mouth daily., Disp: 150 tablet, Rfl: 2;  metoprolol (LOPRESSOR) 50 MG tablet, Take 1 tablet (50 mg total) by mouth 2 (two) times daily., Disp: 60 tablet, Rfl: 6  Past Medical History  Diagnosis Date  . Rheumatoid arthritis   . GERD (gastroesophageal reflux disease)   . Hypothyroidism   . Atrial flutter     RFA 10/09 (Dr. Graciela Husbands)  . Atrial fibrillation     Paroxysmal, CHADS2 score 0-1    Social History Dennis Adams reports that he quit smoking about 44 years ago. His smoking use included Cigarettes. His smokeless tobacco use includes Chew. Dennis Adams reports that he does not drink alcohol.  Review of Systems Complains of  arthritic neck pain. Fairly chronic problems with balance. He uses a cane to ambulate. Admits to intermittent falls. No syncope however.  Physical Examination Filed Vitals:   09/16/10 1054  BP: 107/72  Pulse: 88  Additional Exam: Normally nourished appearing male in no acute distress.  HEENT: Conjunctiva and lids normal, oropharynx clear.  Neck: Supple, no elevated venous pressure or bruits.  Lungs: Clear to auscultation, nonlabored.  Cardiac: Regular rate and rhythm, soft systolic murmur at the right base. No S3.  Abdomen: Soft, nontender, no bruits.  Extremities: No pitting edema, distal pulses 2+.  Skin: Warm and dry.  Muscular skeletal: No  kyphosis.  Neuropsychiatric: Alert and oriented x3, affect appropriate.   ECG Atrial fibrillation at 131 beats per minute with occasional aberrant conduction, nonspecific ST-T changes.   Problem List and Plan

## 2010-09-16 NOTE — Assessment & Plan Note (Signed)
Status post radiofrequency ablation in the past by Dr. Graciela Husbands.

## 2010-09-19 ENCOUNTER — Encounter: Payer: Self-pay | Admitting: Cardiology

## 2010-10-29 ENCOUNTER — Ambulatory Visit (INDEPENDENT_AMBULATORY_CARE_PROVIDER_SITE_OTHER): Payer: Medicare Other | Admitting: Cardiology

## 2010-10-29 ENCOUNTER — Encounter: Payer: Self-pay | Admitting: Cardiology

## 2010-10-29 VITALS — BP 118/68 | HR 60 | Ht 72.0 in | Wt 157.0 lb

## 2010-10-29 DIAGNOSIS — Z720 Tobacco use: Secondary | ICD-10-CM | POA: Insufficient documentation

## 2010-10-29 DIAGNOSIS — F17200 Nicotine dependence, unspecified, uncomplicated: Secondary | ICD-10-CM

## 2010-10-29 DIAGNOSIS — I4891 Unspecified atrial fibrillation: Secondary | ICD-10-CM

## 2010-10-29 NOTE — Assessment & Plan Note (Signed)
Long-term smoker. We have discussed smoking cessation, however he has not been able to quit.

## 2010-10-29 NOTE — Assessment & Plan Note (Signed)
In sinus rhythm on examination today. Patient reports good control of palpitations on present beta blocker dose. Continue aspirin.

## 2010-10-29 NOTE — Patient Instructions (Signed)
Follow up as scheduled. Your physician recommends that you continue on your current medications as directed. Please refer to the Current Medication list given to you today. 

## 2010-10-29 NOTE — Progress Notes (Signed)
Clinical Summary [name] is a 76 y.o.male presenting for followup. He was seen in June. We have been managing his paroxysmal atrial fibrillation with aspirin and beta blocker therapy, concerned about higher bleeding risk on anticoagulant therapy with history of balance problems and falls.  He reports no significant palpitations on the current regimen. Continues to use a cane to steady himself. Reports no chest pain or progressive shortness of breath.  He states that his wife is now a nursing home with Alzheimer's dementia. He visits her daily.   Allergies  Allergen Reactions  . Celecoxib     Current outpatient prescriptions:aspirin EC 81 MG tablet, Take 1 tablet (81 mg total) by mouth daily., Disp: 150 tablet, Rfl: 2;  Calcium Carbonate (CALCIUM 500 PO), Take 2 tablets by mouth daily.  , Disp: , Rfl: ;  folic acid (FOLVITE) 800 MCG tablet, Take 800 mcg by mouth daily.  , Disp: , Rfl: ;  Glucosamine 500 MG CAPS, Take 1 capsule by mouth daily.  , Disp: , Rfl:  HYDROcodone-acetaminophen (VICODIN) 5-500 MG per tablet, Take 1 tablet by mouth every 6 (six) hours as needed.  , Disp: , Rfl: ;  iron polysaccharides (NU-IRON) 150 MG capsule, Take 150 mg by mouth 2 (two) times daily.  , Disp: , Rfl: ;  leflunomide (ARAVA) 10 MG tablet, Take 10 mg by mouth daily.  , Disp: , Rfl: ;  levothyroxine (SYNTHROID, LEVOTHROID) 50 MCG tablet, Take 50 mcg by mouth daily.  , Disp: , Rfl:  methotrexate (RHEUMATREX) 2.5 MG tablet, Take 2.5 mg by mouth as directed. Caution:Chemotherapy. Protect from light. , Disp: , Rfl: ;  metoprolol (LOPRESSOR) 50 MG tablet, Take 1 tablet (50 mg total) by mouth 2 (two) times daily., Disp: 60 tablet, Rfl: 6;  Multiple Vitamins-Minerals (CENTRUM SILVER PO), Take 1 tablet by mouth daily.  , Disp: , Rfl: ;  Omega-3 Fatty Acids (FISH OIL) 1200 MG CAPS, Take 2 capsules by mouth daily.  , Disp: , Rfl:  predniSONE (DELTASONE) 5 MG tablet, Take 7.5 mg by mouth daily.  , Disp: , Rfl: ;  Red Yeast  Rice 600 MG TABS, Take 1 tablet by mouth daily.  , Disp: , Rfl: ;  Tamsulosin HCl (FLOMAX) 0.4 MG CAPS, Take 0.4 mg by mouth daily.  , Disp: , Rfl: ;  vitamin B-12 (CYANOCOBALAMIN) 1000 MCG tablet, Take 1,000 mcg by mouth daily.  , Disp: , Rfl:   Past Medical History  Diagnosis Date  . Rheumatoid arthritis   . GERD (gastroesophageal reflux disease)   . Hypothyroidism   . Atrial flutter     RFA 10/09 (Dr. Graciela Adams)  . Atrial fibrillation     Paroxysmal, CHADS2 score 0-1    Past Surgical History  Procedure Date  . Cholecystectomy   . Lumbar disc surgery     Family History  Problem Relation Age of Onset  . Coronary artery disease Father   . Heart attack Father   . Coronary artery disease Mother     Late    Social History [name] reports that he quit smoking about 44 years ago. His smoking use included Cigarettes. His smokeless tobacco use includes Chew. [name] reports that he does not drink alcohol.  Review of Systems The reported melena or hematochezia. No cough or hemoptysis. Stable appetite. Otherwise negative.  Physical Examination Filed Vitals:   10/29/10 0842  BP: 118/68  Pulse: 60   Normally nourished appearing male in no acute distress.  HEENT: Conjunctiva  and lids normal, oropharynx clear.  Neck: Supple, no elevated venous pressure or bruits.  Lungs: Clear to auscultation, nonlabored.  Cardiac: Regular rate and rhythm, II/VI systolic murmur at the right base. No S3.  Abdomen: Soft, nontender, no bruits.  Extremities: No pitting edema, distal pulses 2+.  Skin: Warm and dry.  Muscular skeletal: No kyphosis.  Neuropsychiatric: Alert and oriented x3, affect appropriate.    Problem List and Plan

## 2010-12-27 ENCOUNTER — Encounter: Payer: Self-pay | Admitting: Cardiology

## 2011-01-28 ENCOUNTER — Encounter: Payer: Self-pay | Admitting: Cardiology

## 2011-03-03 ENCOUNTER — Ambulatory Visit: Payer: Medicare Other | Admitting: Cardiology

## 2011-03-10 ENCOUNTER — Encounter: Payer: Self-pay | Admitting: Cardiology

## 2011-03-11 ENCOUNTER — Ambulatory Visit (INDEPENDENT_AMBULATORY_CARE_PROVIDER_SITE_OTHER): Payer: Medicare Other | Admitting: Cardiology

## 2011-03-11 ENCOUNTER — Encounter: Payer: Self-pay | Admitting: Cardiology

## 2011-03-11 VITALS — BP 134/70 | HR 85 | Resp 16 | Ht 72.0 in | Wt 162.0 lb

## 2011-03-11 DIAGNOSIS — R011 Cardiac murmur, unspecified: Secondary | ICD-10-CM

## 2011-03-11 DIAGNOSIS — Z720 Tobacco use: Secondary | ICD-10-CM

## 2011-03-11 DIAGNOSIS — F17200 Nicotine dependence, unspecified, uncomplicated: Secondary | ICD-10-CM

## 2011-03-11 DIAGNOSIS — I4891 Unspecified atrial fibrillation: Secondary | ICD-10-CM

## 2011-03-11 MED ORDER — METOPROLOL TARTRATE 50 MG PO TABS: ORAL_TABLET | ORAL | Status: DC

## 2011-03-11 NOTE — Progress Notes (Signed)
Clinical Summary Dennis Adams is a 76 y.o.male presenting for followup. He was seen in July. He reports no major sense of palpitations. Does state that he is short of breath when he gets up in the mornings, also with exertion. Generally seems to be better as the day goes on. He denies any frank orthopnea or PND. No chest pain.  Still visits his wife daily - she resides in a nursing home. States that he has not missed a single day seeing her.  Still ambulate with a cane, somewhat unsteady. Has had some falls but no major injury.   Allergies  Allergen Reactions  . Celecoxib     Medication list reviewed.  Past Medical History  Diagnosis Date  . Rheumatoid arthritis   . GERD (gastroesophageal reflux disease)   . Hypothyroidism   . Atrial flutter     RFA 10/09 (Dr. Graciela Husbands)  . Atrial fibrillation     Paroxysmal, CHADS2 score 0-1    Past Surgical History  Procedure Date  . Cholecystectomy   . Lumbar disc surgery     Family History  Problem Relation Age of Onset  . Coronary artery disease Father   . Heart attack Father   . Coronary artery disease Mother     Late    Social History Dennis Adams reports that he quit smoking about 44 years ago. His smoking use included Cigarettes. His smokeless tobacco use includes Chew. Dennis Adams reports that he does not drink alcohol.  Review of Systems As outlined above. Intermittent cough. No fevers or chills. Stable appetite. No lower extremity edema. The was negative.  Physical Examination Filed Vitals:   03/11/11 0926  BP: 134/70  Pulse: 85  Resp:     Normally nourished appearing male in no acute distress.  HEENT: Conjunctiva and lids normal, oropharynx clear.  Neck: Supple, no elevated venous pressure or bruits.  Lungs: Clear to auscultation, nonlabored.  Cardiac: Regular rate and rhythm, II-III/VI systolic murmur at the right base. No S3.  Abdomen: Soft, nontender, no bruits.  Extremities: No pitting edema, distal pulses 2+.    Skin: Warm and dry.  Muscular skeletal: No kyphosis.  Neuropsychiatric: Alert and oriented x3, affect appropriate.     Problem List and Plan

## 2011-03-11 NOTE — Assessment & Plan Note (Signed)
No reported palpitations. In sinus rhythm by exam today. Heart rate does seem to increase perhaps somewhat more than necessary when he ambulates, was over 100 after walking into the room today, settled down to the 80s. We discussed increasing Lopressor to 75 mg twice daily.

## 2011-03-11 NOTE — Assessment & Plan Note (Signed)
We have discussed smoking cessation over time. He has had difficulty quitting.

## 2011-03-11 NOTE — Assessment & Plan Note (Signed)
Followup 2-D echocardiogram will be obtained to assess status of aortic valve, also LV function.

## 2011-03-11 NOTE — Patient Instructions (Signed)
   Echo  If the results of your test are normal or stable, you will receive a letter.  If they are abnormal, the nurse will contact you by phone. Increase Lopressor to 75mg  twice a day   Your physician wants you to follow up in: 6 months.  You will receive a reminder letter in the mail one-two months in advance.  If you don't receive a letter, please call our office to schedule the follow up appointment

## 2011-03-20 ENCOUNTER — Other Ambulatory Visit (INDEPENDENT_AMBULATORY_CARE_PROVIDER_SITE_OTHER): Payer: Medicare Other | Admitting: *Deleted

## 2011-03-20 DIAGNOSIS — R011 Cardiac murmur, unspecified: Secondary | ICD-10-CM

## 2011-03-20 DIAGNOSIS — I4891 Unspecified atrial fibrillation: Secondary | ICD-10-CM

## 2011-03-21 ENCOUNTER — Telehealth: Payer: Self-pay | Admitting: *Deleted

## 2011-03-21 NOTE — Telephone Encounter (Signed)
Message copied by Arlyss Gandy on Fri Mar 21, 2011 10:09 AM ------      Message from: MCDOWELL, Illene Bolus      Created: Thu Mar 20, 2011  4:15 PM       Reviewed. LVEF is normal. Has evidence of moderate aortic stenosis which would explain cardiac murmur. Continue observation for now.

## 2011-03-21 NOTE — Telephone Encounter (Signed)
Pt notified of results and verbalized understanding  

## 2011-05-11 ENCOUNTER — Encounter: Payer: Self-pay | Admitting: Physician Assistant

## 2011-05-14 ENCOUNTER — Encounter: Payer: Self-pay | Admitting: Physician Assistant

## 2011-05-14 ENCOUNTER — Other Ambulatory Visit: Payer: Self-pay | Admitting: Physician Assistant

## 2011-07-17 IMAGING — CR DG CHEST 2V
2 series · 2 of 2 positions shown · non-contrast
Comparison: None
Correlation:  CT chest 06/01/2007

CLINICAL DATA: Rheumatoid arthritis left shoulder, preoperative
assessment for total shoulder replacement, hypertension,
tachycardia, former smoker

CHEST - 2 VIEW

[view not recorded (1 of 2)]
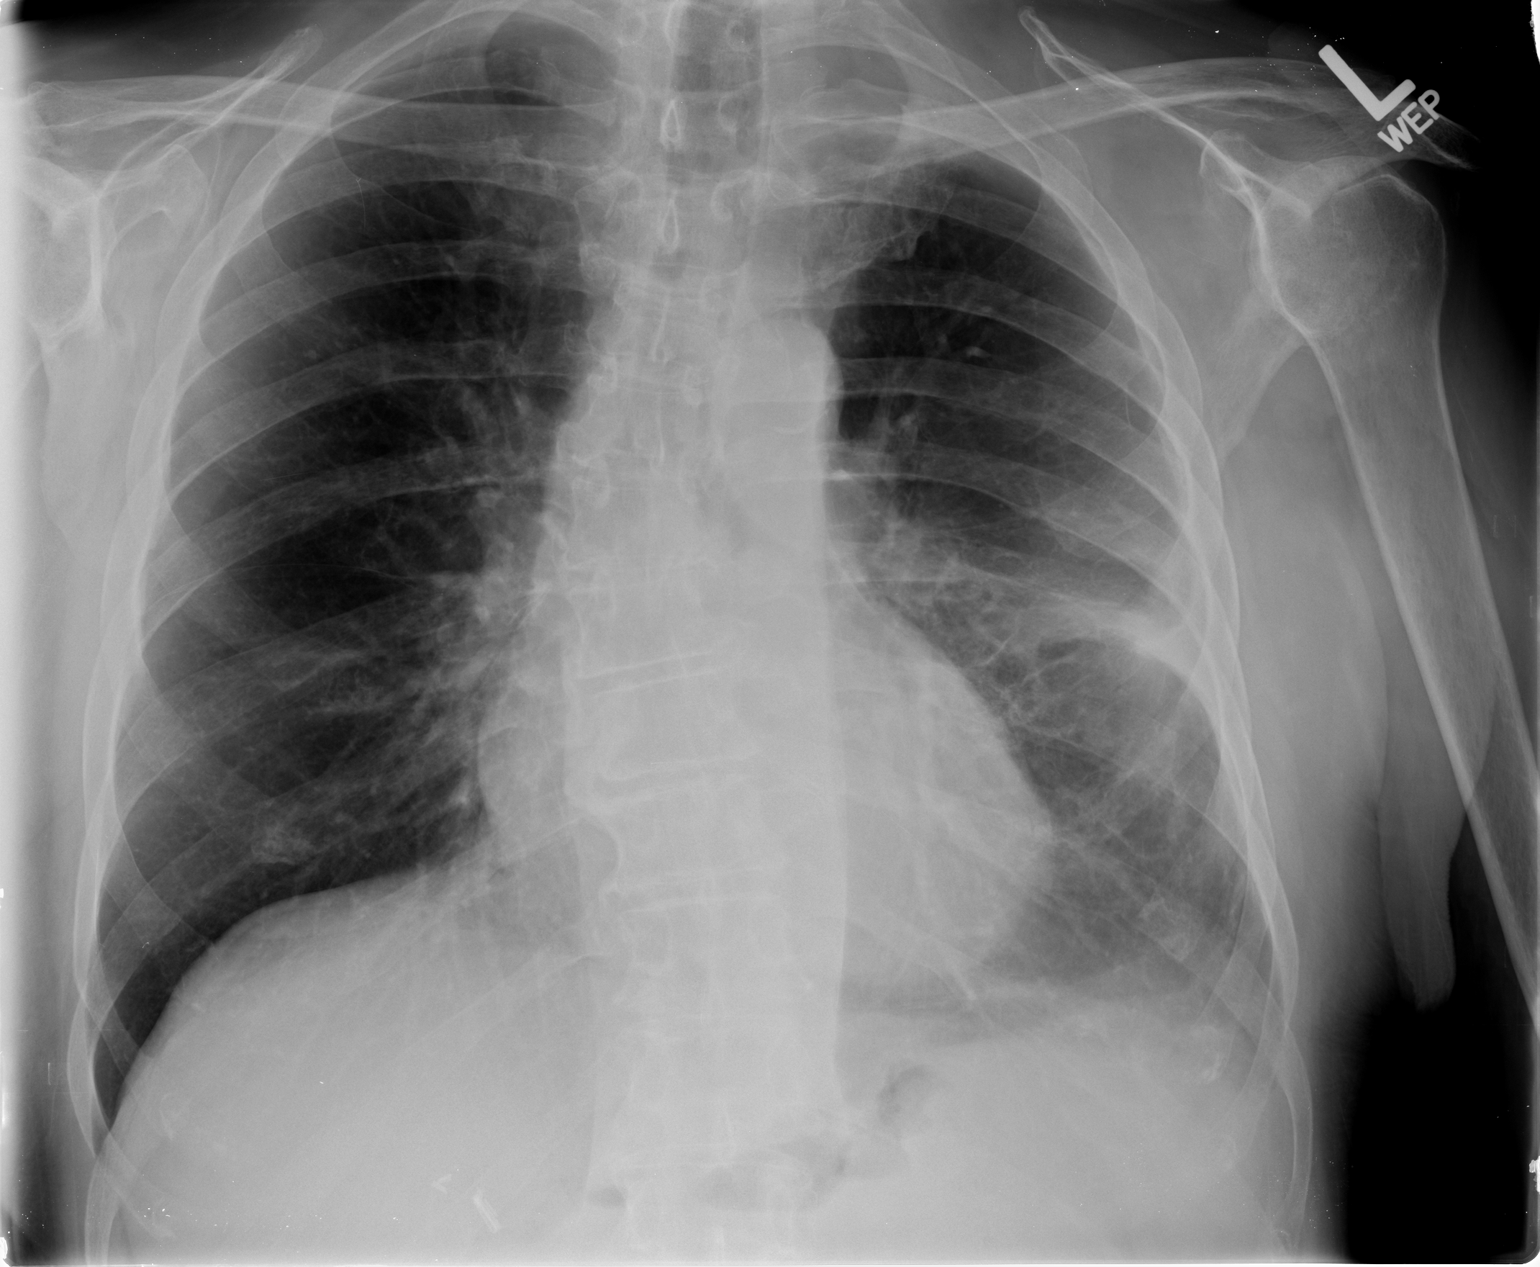

[view not recorded (2 of 2)]
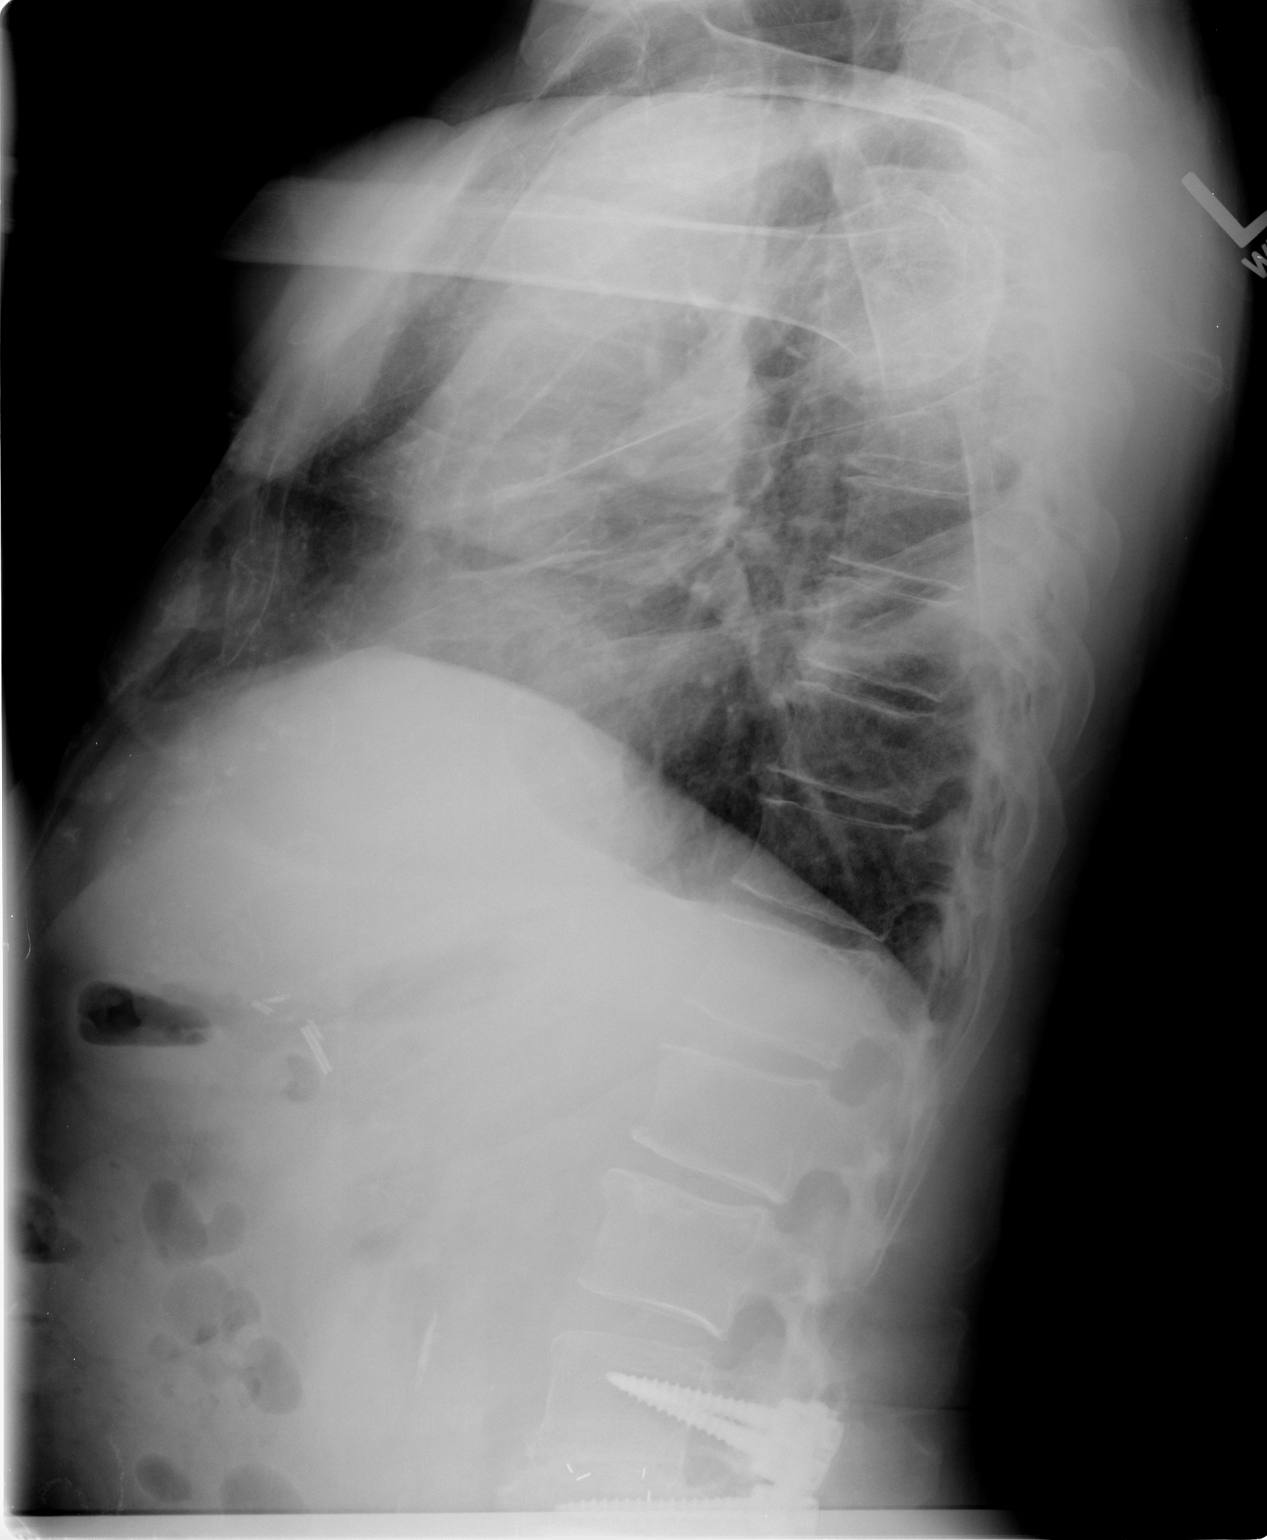

[2 of 2 positions shown; findings below may reference images not displayed]

FINDINGS: Upper normal heart size.
Minimal atherosclerotic calcification aortic arch.
Stable mediastinal contours and pulmonary vascularity.
Chronic peribronchial thickening.
Chronic pleural thickening in mid-to-lower left chest stable, with
associated parenchymal lung scarring.
Remaining lungs clear.
Bones appear demineralized with bilateral glenohumeral joint space
narrowing.
Dextroconvex scoliosis mid thoracic spine.
Surgical clips right upper quadrant question cholecystectomy.
IMPRESSION: Chronic pleural thickening and scarring in lower left chest.
Scoliosis.
No definite acute cardiopulmonary abnormalities.

## 2011-12-08 ENCOUNTER — Other Ambulatory Visit: Payer: Self-pay | Admitting: Cardiology

## 2011-12-10 ENCOUNTER — Other Ambulatory Visit: Payer: Self-pay | Admitting: *Deleted

## 2011-12-10 MED ORDER — METOPROLOL TARTRATE 50 MG PO TABS: 75.0000 mg | ORAL_TABLET | Freq: Two times a day (BID) | ORAL | Status: DC

## 2012-01-29 ENCOUNTER — Other Ambulatory Visit: Payer: Self-pay | Admitting: Cardiology

## 2012-01-29 MED ORDER — METOPROLOL TARTRATE 50 MG PO TABS: 75.0000 mg | ORAL_TABLET | Freq: Two times a day (BID) | ORAL | Status: DC

## 2012-01-29 NOTE — Telephone Encounter (Signed)
Requesting to change quantity to #90 for 1 months supply. New prescription sent to [facility].

## 2012-01-29 NOTE — Addendum Note (Signed)
Addended by: Eustace Moore on: 01/29/2012 01:41 PM   Modules accepted: Orders

## 2012-02-26 ENCOUNTER — Ambulatory Visit (INDEPENDENT_AMBULATORY_CARE_PROVIDER_SITE_OTHER): Payer: Medicare Other | Admitting: Physician Assistant

## 2012-02-26 ENCOUNTER — Encounter: Payer: Self-pay | Admitting: Physician Assistant

## 2012-02-26 ENCOUNTER — Encounter: Payer: Self-pay | Admitting: *Deleted

## 2012-02-26 VITALS — BP 120/88 | HR 60 | Ht 72.0 in | Wt 153.0 lb

## 2012-02-26 DIAGNOSIS — F17200 Nicotine dependence, unspecified, uncomplicated: Secondary | ICD-10-CM

## 2012-02-26 DIAGNOSIS — I4891 Unspecified atrial fibrillation: Secondary | ICD-10-CM

## 2012-02-26 DIAGNOSIS — R9431 Abnormal electrocardiogram [ECG] [EKG]: Secondary | ICD-10-CM

## 2012-02-26 DIAGNOSIS — I359 Nonrheumatic aortic valve disorder, unspecified: Secondary | ICD-10-CM

## 2012-02-26 DIAGNOSIS — I350 Nonrheumatic aortic (valve) stenosis: Secondary | ICD-10-CM

## 2012-02-26 DIAGNOSIS — Z720 Tobacco use: Secondary | ICD-10-CM

## 2012-02-26 MED ORDER — ASPIRIN EC 81 MG PO TBEC: 81.0000 mg | DELAYED_RELEASE_TABLET | Freq: Every day | ORAL | Status: AC

## 2012-02-26 NOTE — Progress Notes (Signed)
Primary Cardiologist: Simona Huh, MD   HPI: Patient presents for annual followup. When last seen, a followup echocardiogram was ordered, with results as follows:    - 2-D echo, 03/2011: EF 55-60%, no WMAs, moderate AS  Lopressor was increased to 75 twice a day, for more aggressive management of elevated heart rate with ambulation.  He presents today reporting no interim development of exertional CP, DOE, tachycardia palpitations, or near syncope/syncope. He continues to remain quite active, including driving.  Twelve-lead EKG today, reviewed by me, reveals NSR 68 bpm; normal axis; isolated PVC; T wave inversion in inferolateral leads  Allergies  Allergen Reactions  . Celecoxib     Current Outpatient Prescriptions  Medication Sig Dispense Refill  . Calcium Carbonate (CALCIUM 500 PO) Take 2 tablets by mouth daily.        . folic acid (FOLVITE) 800 MCG tablet Take 800 mcg by mouth daily.        . Glucosamine 500 MG CAPS Take 1 capsule by mouth daily.        Marland Kitchen HYDROcodone-acetaminophen (VICODIN) 5-500 MG per tablet Take 1 tablet by mouth every 6 (six) hours as needed.        . iron polysaccharides (NU-IRON) 150 MG capsule Take 150 mg by mouth 2 (two) times daily.        Marland Kitchen leflunomide (ARAVA) 10 MG tablet Take 10 mg by mouth daily.        Marland Kitchen levothyroxine (SYNTHROID, LEVOTHROID) 50 MCG tablet Take 50 mcg by mouth daily.        . methotrexate (RHEUMATREX) 2.5 MG tablet Take 2.5 mg by mouth as directed. Caution:Chemotherapy. Protect from light.       . metoprolol (LOPRESSOR) 50 MG tablet Take 1.5 tablets (75 mg total) by mouth 2 (two) times daily.  90 tablet  0  . Multiple Vitamins-Minerals (CENTRUM SILVER PO) Take 1 tablet by mouth daily.        . Omega-3 Fatty Acids (FISH OIL) 1200 MG CAPS Take 2 capsules by mouth daily.        . predniSONE (DELTASONE) 5 MG tablet Take 7.5 mg by mouth daily.        . Red Yeast Rice 600 MG TABS Take 1 tablet by mouth daily.        . Tamsulosin HCl  (FLOMAX) 0.4 MG CAPS Take 0.4 mg by mouth daily.        . vitamin B-12 (CYANOCOBALAMIN) 1000 MCG tablet Take 1,000 mcg by mouth daily.        Marland Kitchen aspirin EC 81 MG tablet Take 1 tablet (81 mg total) by mouth daily.      . [DISCONTINUED] metoprolol (LOPRESSOR) 50 MG tablet Take 1.5 tablets (75 mg total) by mouth 2 (two) times daily.  90 tablet  2    Past Medical History  Diagnosis Date  . Rheumatoid arthritis   . GERD (gastroesophageal reflux disease)   . Hypothyroidism   . Atrial flutter     RFA 10/09 (Dr. Graciela Husbands)  . Atrial fibrillation     Paroxysmal, CHADS2 score 0-1    Past Surgical History  Procedure Date  . Cholecystectomy   . Lumbar disc surgery     History   Social History  . Marital Status: Married    Spouse Name: N/A    Number of Children: N/A  . Years of Education: N/A   Occupational History  . Retired    Social History Main Topics  . Smoking status: Former Smoker  Types: Cigarettes    Quit date: 04/07/1966  . Smokeless tobacco: Current User    Types: Chew     Comment: chew 1 pack every  2weeks  . Alcohol Use: No  . Drug Use: No  . Sexually Active: Not on file   Other Topics Concern  . Not on file   Social History Narrative  . No narrative on file   Social History Narrative  . No narrative on file    Problem Relation Age of Onset  . Coronary artery disease Father   . Heart attack Father   . Coronary artery disease Mother     Late    ROS: no nausea, vomiting; no fever, chills; no melena, hematochezia; no claudication  PHYSICAL EXAM: BP 120/88  Pulse 60  Ht 6' (1.829 m)  Wt 153 lb (69.4 kg)  BMI 20.75 kg/m2 GENERAL: 77 year-old male; NAD HEENT: NCAT, PERRLA, EOMI; sclera clear; no xanthelasma NECK: palpable bilateral carotid pulses, no bruits; no JVD; no TM LUNGS: CTA bilaterally CARDIAC: RRR (S1, S2); 2-3/6 short systolic ejection murmur at base; no rubs or gallops ABDOMEN: soft, non-tender; intact BS EXTREMETIES: no significant  peripheral edema SKIN: warm/dry; no obvious rash/lesions MUSCULOSKELETAL: no joint deformity NEURO: no focal deficit; NL affect   EKG: reviewed and available in Electronic Records   ASSESSMENT & PLAN:  ATRIAL FIBRILLATION Continue current medication regimen, which consists of beta blocker therapy for rate/rhythm control. Patient is not on ASA, secondary to history of GIB. He has a CHADS2 of 1 (age). Following discussion, however, he has agreed to resume ASA, at the low dose of 81 mg daily.  Aortic stenosis Previously assessed as moderate, by echocardiography, 07/2010. We'll order a followup echocardiogram for reassessment.  Tobacco abuse Patient has since stopped smoking  Abnormal EKG Abnormal EKG was reviewed with Dr. Jens Som, and plan is to proceed with a pharmacologic nuclear imaging study to rule out acute occult ischemia. If the study is positive, then we will need to have him return to discuss possible cardiac catheterization. If negative, then continued medical management is recommended, in the absence of any symptoms. Will also followup on the echocardiogram to see if there's been any change in LV function, or a development of new wall motion abnormalities. Of note, patient has agreed to start low-dose ASA for his history of paroxysmal atrial fibrillation.    Gene Janyla Biscoe, PAC

## 2012-02-26 NOTE — Assessment & Plan Note (Signed)
Patient has since stopped smoking 

## 2012-02-26 NOTE — Patient Instructions (Addendum)
   Echo  [employer] (stress test)  Office will contact with results  Begin Aspirin 81mg  daily Continue all other current medications. Your physician wants you to follow up in: 6 months.  You will receive a reminder letter in the mail one-two months in advance.  If you don't receive a letter, please call our office to schedule the follow up appointment

## 2012-02-26 NOTE — Assessment & Plan Note (Signed)
Previously assessed as moderate, by echocardiography, 07/2010. We'll order a followup echocardiogram for reassessment.

## 2012-02-26 NOTE — Assessment & Plan Note (Signed)
Abnormal EKG was reviewed with Dr. Jens Som, and plan is to proceed with a pharmacologic nuclear imaging study to rule out acute occult ischemia. If the study is positive, then we will need to have him return to discuss possible cardiac catheterization. If negative, then continued medical management is recommended, in the absence of any symptoms. Will also followup on the echocardiogram to see if there's been any change in LV function, or a development of new wall motion abnormalities. Of note, patient has agreed to start low-dose ASA for his history of paroxysmal atrial fibrillation.

## 2012-02-26 NOTE — Assessment & Plan Note (Addendum)
Continue current medication regimen, which consists of beta blocker therapy for rate/rhythm control. Patient is not on ASA, secondary to history of GIB. He has a CHADS2 of 1 (age). Following discussion, however, he has agreed to resume ASA, at the low dose of 81 mg daily.

## 2012-02-27 ENCOUNTER — Telehealth: Payer: Self-pay | Admitting: Physician Assistant

## 2012-02-27 ENCOUNTER — Ambulatory Visit: Payer: Medicare Other | Admitting: Cardiology

## 2012-02-27 ENCOUNTER — Other Ambulatory Visit: Payer: Self-pay | Admitting: Physician Assistant

## 2012-02-27 DIAGNOSIS — I350 Nonrheumatic aortic (valve) stenosis: Secondary | ICD-10-CM

## 2012-02-27 DIAGNOSIS — R9431 Abnormal electrocardiogram [ECG] [EKG]: Secondary | ICD-10-CM

## 2012-02-27 NOTE — Telephone Encounter (Signed)
Lexiscan Cardiolite /Echo complete   Scheduled for 03-08-12 [facility]

## 2012-03-02 NOTE — Telephone Encounter (Signed)
Clinicals faxed to [employer]...

## 2012-03-05 NOTE — Telephone Encounter (Signed)
[employer] has not approved the nuclear stress test.   Please cancel nuc test for now and let pt know we will reschedule at a later date or Dr. Diona Browner needs to change to other test or call and do MD review.  It is in MD review.  The 2D echo is approved and ZOXW#[id number] expires 04-19-12.

## 2012-03-08 NOTE — Telephone Encounter (Signed)
Spoke with Mr. Zurawski. He is going to come by the office today with his insurance card.

## 2012-03-09 NOTE — Telephone Encounter (Signed)
Will proceed with Dobutamine-stress echocardiogram Per Gene

## 2012-03-10 ENCOUNTER — Telehealth: Payer: Self-pay | Admitting: *Deleted

## 2012-03-10 NOTE — Telephone Encounter (Signed)
Pt had an abnormal EKG during recent routine visit. Despite not reporting any CP, Dr Jens Som and I were concerned that he might be experiencing occult angina. Therefore, I would strongly recommend pursuing with our original recommendation for a stress test, to r/o underlying ischemia.

## 2012-03-10 NOTE — Telephone Encounter (Signed)
Message copied by Lesle Chris on Wed Mar 10, 2012 11:02 AM ------      Message from: Prescott Parma C      Created: Wed Mar 10, 2012  8:09 AM       NL LVF and stable, moderate AS. Continue current medication regimen.

## 2012-03-10 NOTE — Telephone Encounter (Signed)
Will forward to [facility] to contact patient about this procedure will need order written if patient agrees to the test.

## 2012-03-10 NOTE — Telephone Encounter (Signed)
Mr. Kalas called and said that unless it is necessary he does not want to do another test. States that the $$ is unreal and he can Not afford to continue having test. Please advise about the Dobutamine-stress echo.

## 2012-03-10 NOTE — Telephone Encounter (Signed)
Notes Recorded by Lesle Chris, LPN on 41/06/2438 at 11:02 AM Patient notified by L. Dareen Piano, LPN.

## 2012-03-12 NOTE — Telephone Encounter (Signed)
Received percert from patient's insurance company to schedule [employer].  [id number]

## 2012-03-15 NOTE — Telephone Encounter (Signed)
Patient informed of below & still declines to have any further testing done at this time.  Informed message will be sent to provider (Gene Serpe, PA) to make him aware.  Patient is not due for follow up for 6 months.  Do you want him back any sooner since declining testing?

## 2012-03-15 NOTE — Telephone Encounter (Signed)
Thanks. If pt refuses recommended testing, then nothing further to add. He can f/u with SM in 6 months as planned, sooner if he develops exertional CP or SOB.

## 2012-03-17 NOTE — Telephone Encounter (Signed)
Patient notified of below.  Patient verbalized understanding.

## 2012-04-30 ENCOUNTER — Other Ambulatory Visit: Payer: Self-pay | Admitting: Cardiology

## 2012-04-30 MED ORDER — METOPROLOL TARTRATE 50 MG PO TABS: 75.0000 mg | ORAL_TABLET | Freq: Two times a day (BID) | ORAL | Status: DC

## 2012-10-01 DIAGNOSIS — N139 Obstructive and reflux uropathy, unspecified: Secondary | ICD-10-CM

## 2012-10-04 DIAGNOSIS — I6789 Other cerebrovascular disease: Secondary | ICD-10-CM

## 2013-07-28 DIAGNOSIS — C4490 Unspecified malignant neoplasm of skin, unspecified: Secondary | ICD-10-CM

## 2013-07-28 HISTORY — DX: Unspecified malignant neoplasm of skin, unspecified: C44.90

## 2013-07-29 NOTE — H&P (Signed)
  Subjective:   Patient ID: Dennis Adams is a 79 y.o. male.  HPI  Referred by Dr. Harvel Quale following Mohs resection SCC of right cheek. Verbal report of Dr Harvel Quale tumor with perineural invasion and plan for XRT. Per pt and daughters, was able to clear lesion via Mohs. No notes available today.  Patient has history of over 20 skin cancer resection, no hx melanoma. Patient reports he was a tanning bed user in past. His resections include left temple excision that incurred frontal n branch resection and subsequent left brow lift direct.  Patient reports he fell over last year and has had chronic numbness over right face, difficulty chewing, prior to current resection.   Review of Systems  Endocrine: Positive for cold intolerance.  Musculoskeletal: Positive for arthralgias, back pain and myalgias.  Hematological: Bruises/bleeds easily.  All other systems reviewed and are negative.   Objective:   Physical Exam  Constitutional: He is oriented to person, place, and time.  HENT:  Right cheek 9 x 5.5 cm open wound with parotid fascia and fascia over zygoma intact, unable to raise brows bilaterally, temporal hollowing bilaterally  Cardiovascular: Normal rate and normal heart sounds.  Pulmonary/Chest: Effort normal and breath sounds normal.  Neurological: He is alert and oriented to person, place, and time. A cranial nerve deficit is present.  Bilateral absent frontal n branch with no brow raising; otherwise symmetric smile, and remainder CN present, symmetric  Psychiatric: He has a normal mood and affect.   Assessment:   Open wound right cheek post Mohs   Plan:   Patient has redundant/lax skin of cheek and neck that we will attempt to utilize to close wound However given size of wound will need to plan for skin grafting for remaining defect. Discussed that he adjacent skin of neck and cheek has significant sun damage and will need to continue to be monitored closely for new skin cancers. His medial  arms are relatively free of sun damage and can utilize this for grafting. Risks bleeding, bruising, pain, failure graft, need for additional surgery or wound care, recurrence skin cancer, asymmetry, unacceptable cosmetic appearance reviewed. Post procedure bolster, visits and limitations discussed. Given his methotrexate, steroid, high risk wound healing problems.  Plan OP surgery early next week. Vaseline to wound in interim, ok to shower.  Also discussed that he has no frontalis function bilaterally- per family right side is new. Discussed he may require brow lifting in future for this side due to ptosis and obstructed vision.  Pictures taken.

## 2013-08-01 ENCOUNTER — Encounter (HOSPITAL_BASED_OUTPATIENT_CLINIC_OR_DEPARTMENT_OTHER): Payer: Self-pay | Admitting: *Deleted

## 2013-08-01 NOTE — Progress Notes (Signed)
Called dr Louie Casa 4/24 to have them put in orders as sign and held-not able to see orders Found out today he is in a nuring home in [place]-faxing notes and any labs and ekg-said he was alert and oriented and could sign a permit

## 2013-08-01 NOTE — Progress Notes (Signed)
Talked with [facility] and daughter-she will bring him-he can sign permit-will need ekg istat

## 2013-08-02 ENCOUNTER — Encounter (HOSPITAL_BASED_OUTPATIENT_CLINIC_OR_DEPARTMENT_OTHER)
Admission: RE | Disposition: A | Discharge status: Home / Self Care | Payer: Self-pay | Source: Ambulatory Visit | Attending: Plastic Surgery

## 2013-08-02 ENCOUNTER — Encounter (HOSPITAL_BASED_OUTPATIENT_CLINIC_OR_DEPARTMENT_OTHER): Payer: Self-pay | Admitting: *Deleted

## 2013-08-02 ENCOUNTER — Ambulatory Visit (HOSPITAL_BASED_OUTPATIENT_CLINIC_OR_DEPARTMENT_OTHER)
Admission: RE | Admit: 2013-08-02 | Discharge: 2013-08-02 | Disposition: A | Discharge status: Home / Self Care | Payer: Medicare Other | Source: Ambulatory Visit | Attending: Plastic Surgery | Admitting: Plastic Surgery

## 2013-08-02 ENCOUNTER — Ambulatory Visit (HOSPITAL_BASED_OUTPATIENT_CLINIC_OR_DEPARTMENT_OTHER): Payer: Medicare Other | Admitting: Anesthesiology

## 2013-08-02 ENCOUNTER — Encounter (HOSPITAL_BASED_OUTPATIENT_CLINIC_OR_DEPARTMENT_OTHER): Payer: Medicare Other | Admitting: Anesthesiology

## 2013-08-02 DIAGNOSIS — S01409A Unspecified open wound of unspecified cheek and temporomandibular area, initial encounter: Secondary | ICD-10-CM

## 2013-08-02 DIAGNOSIS — E039 Hypothyroidism, unspecified: Secondary | ICD-10-CM | POA: Insufficient documentation

## 2013-08-02 DIAGNOSIS — M069 Rheumatoid arthritis, unspecified: Secondary | ICD-10-CM | POA: Insufficient documentation

## 2013-08-02 DIAGNOSIS — K219 Gastro-esophageal reflux disease without esophagitis: Secondary | ICD-10-CM | POA: Insufficient documentation

## 2013-08-02 DIAGNOSIS — R209 Unspecified disturbances of skin sensation: Secondary | ICD-10-CM | POA: Insufficient documentation

## 2013-08-02 DIAGNOSIS — Z481 Encounter for planned postprocedural wound closure: Secondary | ICD-10-CM | POA: Insufficient documentation

## 2013-08-02 DIAGNOSIS — Z87891 Personal history of nicotine dependence: Secondary | ICD-10-CM | POA: Insufficient documentation

## 2013-08-02 DIAGNOSIS — I4891 Unspecified atrial fibrillation: Secondary | ICD-10-CM | POA: Insufficient documentation

## 2013-08-02 DIAGNOSIS — C44320 Squamous cell carcinoma of skin of unspecified parts of face: Secondary | ICD-10-CM | POA: Insufficient documentation

## 2013-08-02 DIAGNOSIS — Z9181 History of falling: Secondary | ICD-10-CM | POA: Insufficient documentation

## 2013-08-02 HISTORY — PX: MOHS SURGERY: SUR867

## 2013-08-02 HISTORY — PX: SKIN FULL THICKNESS GRAFT: SHX442

## 2013-08-02 HISTORY — DX: Presence of spectacles and contact lenses: Z97.3

## 2013-08-02 LAB — POCT I-STAT, CHEM 8
BUN: 12 mg/dL (ref 6–23)
CREATININE: 0.7 mg/dL (ref 0.50–1.35)
Calcium, Ion: 1.08 mmol/L — ABNORMAL LOW (ref 1.13–1.30)
Chloride: 103 mEq/L (ref 96–112)
Glucose, Bld: 93 mg/dL (ref 70–99)
HCT: 42 % (ref 39.0–52.0)
Hemoglobin: 14.3 g/dL (ref 13.0–17.0)
Potassium: 3.6 mEq/L — ABNORMAL LOW (ref 3.7–5.3)
SODIUM: 138 meq/L (ref 137–147)
TCO2: 24 mmol/L (ref 0–100)

## 2013-08-02 SURGERY — APPLICATION, GRAFT, SKIN, FULL-THICKNESS
Anesthesia: General | Laterality: Right

## 2013-08-02 MED ORDER — CEFAZOLIN SODIUM-DEXTROSE 2-3 GM-% IV SOLR
2.0000 g | INTRAVENOUS | Status: AC
  Administered 2013-08-02: 2 g via INTRAVENOUS

## 2013-08-02 MED ORDER — FENTANYL CITRATE 0.05 MG/ML IJ SOLN: 50.0000 ug | INTRAMUSCULAR | Status: DC | PRN

## 2013-08-02 MED ORDER — ONDANSETRON HCL 4 MG/2ML IJ SOLN: 4.0000 mg | Freq: Four times a day (QID) | INTRAMUSCULAR | Status: DC | PRN

## 2013-08-02 MED ORDER — LIDOCAINE HCL (CARDIAC) 20 MG/ML IV SOLN
INTRAVENOUS | Status: DC | PRN
  Administered 2013-08-02: 50 mg via INTRAVENOUS

## 2013-08-02 MED ORDER — FENTANYL CITRATE 0.05 MG/ML IJ SOLN
INTRAMUSCULAR | Status: DC | PRN
  Administered 2013-08-02 (×2): 25 ug via INTRAVENOUS

## 2013-08-02 MED ORDER — LIDOCAINE-EPINEPHRINE 0.5 %-1:200000 IJ SOLN
INTRAMUSCULAR | Status: DC | PRN
  Administered 2013-08-02: 8 mL

## 2013-08-02 MED ORDER — FENTANYL CITRATE 0.05 MG/ML IJ SOLN
INTRAMUSCULAR | Status: AC
  Filled 2013-08-02: qty 2

## 2013-08-02 MED ORDER — FENTANYL CITRATE 0.05 MG/ML IJ SOLN
INTRAMUSCULAR | Status: AC
  Filled 2013-08-02: qty 6

## 2013-08-02 MED ORDER — BUPIVACAINE-EPINEPHRINE PF 0.25-1:200000 % IJ SOLN
INTRAMUSCULAR | Status: AC
  Filled 2013-08-02: qty 30

## 2013-08-02 MED ORDER — PROPOFOL 10 MG/ML IV BOLUS
INTRAVENOUS | Status: DC | PRN
  Administered 2013-08-02: 120 mg via INTRAVENOUS

## 2013-08-02 MED ORDER — CEFAZOLIN SODIUM-DEXTROSE 2-3 GM-% IV SOLR
INTRAVENOUS | Status: AC
  Filled 2013-08-02: qty 50

## 2013-08-02 MED ORDER — OXYCODONE HCL 5 MG PO TABS: 5.0000 mg | ORAL_TABLET | ORAL | Status: AC | PRN

## 2013-08-02 MED ORDER — MIDAZOLAM HCL 2 MG/2ML IJ SOLN: 1.0000 mg | INTRAMUSCULAR | Status: DC | PRN

## 2013-08-02 MED ORDER — BACITRACIN ZINC 500 UNIT/GM EX OINT
TOPICAL_OINTMENT | CUTANEOUS | Status: DC | PRN
  Administered 2013-08-02: 1 via TOPICAL

## 2013-08-02 MED ORDER — OXYCODONE HCL 5 MG PO TABS
ORAL_TABLET | ORAL | Status: AC
  Filled 2013-08-02: qty 1

## 2013-08-02 MED ORDER — FENTANYL CITRATE 0.05 MG/ML IJ SOLN
25.0000 ug | INTRAMUSCULAR | Status: DC | PRN
  Administered 2013-08-02: 25 ug via INTRAVENOUS

## 2013-08-02 MED ORDER — MIDAZOLAM HCL 2 MG/2ML IJ SOLN
INTRAMUSCULAR | Status: AC
  Filled 2013-08-02: qty 2

## 2013-08-02 MED ORDER — ONDANSETRON HCL 4 MG/2ML IJ SOLN
INTRAMUSCULAR | Status: DC | PRN
  Administered 2013-08-02: 4 mg via INTRAVENOUS

## 2013-08-02 MED ORDER — OXYCODONE HCL 5 MG/5ML PO SOLN: 5.0000 mg | Freq: Once | ORAL | Status: AC | PRN

## 2013-08-02 MED ORDER — BACITRACIN-NEOMYCIN-POLYMYXIN 400-5-5000 EX OINT
TOPICAL_OINTMENT | CUTANEOUS | Status: AC
  Filled 2013-08-02: qty 1

## 2013-08-02 MED ORDER — LIDOCAINE-EPINEPHRINE 0.5 %-1:200000 IJ SOLN
INTRAMUSCULAR | Status: AC
  Filled 2013-08-02: qty 1

## 2013-08-02 MED ORDER — PHENYLEPHRINE HCL 10 MG/ML IJ SOLN
INTRAMUSCULAR | Status: DC | PRN
  Administered 2013-08-02: 40 ug via INTRAVENOUS
  Administered 2013-08-02: 80 ug via INTRAVENOUS

## 2013-08-02 MED ORDER — OXYCODONE HCL 5 MG PO TABS
5.0000 mg | ORAL_TABLET | Freq: Once | ORAL | Status: AC | PRN
  Administered 2013-08-02: 5 mg via ORAL

## 2013-08-02 MED ORDER — LACTATED RINGERS IV SOLN
INTRAVENOUS | Status: DC
  Administered 2013-08-02: 10:00:00 via INTRAVENOUS
  Administered 2013-08-02: 10 mL/h via INTRAVENOUS

## 2013-08-02 MED ORDER — VITAMIN A 10000 UNITS PO CAPS: 10000.0000 [IU] | ORAL_CAPSULE | Freq: Every day | ORAL | Status: AC

## 2013-08-02 MED ORDER — DEXTROSE 5 % IV SOLN
10.0000 mg | INTRAVENOUS | Status: DC | PRN
  Administered 2013-08-02: 50 ug/min via INTRAVENOUS

## 2013-08-02 MED ORDER — SUCCINYLCHOLINE CHLORIDE 20 MG/ML IJ SOLN
INTRAMUSCULAR | Status: DC | PRN
  Administered 2013-08-02: 100 mg via INTRAVENOUS

## 2013-08-02 SURGICAL SUPPLY — 42 items
BENZOIN TINCTURE PRP APPL 2/3 (GAUZE/BANDAGES/DRESSINGS) IMPLANT
BLADE 15 SAFETY STRL DISP (BLADE) ×6 IMPLANT
BLADE SURG ROTATE 9660 (MISCELLANEOUS) IMPLANT
BRUSH SCRUB EZ PLAIN DRY (MISCELLANEOUS) IMPLANT
CLEANER CAUTERY TIP 5X5 PAD (MISCELLANEOUS) IMPLANT
CLOSURE WOUND 1/2 X4 (GAUZE/BANDAGES/DRESSINGS)
COVER MAYO STAND STRL (DRAPES) ×3 IMPLANT
COVER TABLE BACK 60X90 (DRAPES) ×3 IMPLANT
DRAPE U-SHAPE 76X120 STRL (DRAPES) ×3 IMPLANT
ELECT COATED BLADE 2.86 ST (ELECTRODE) IMPLANT
ELECT NEEDLE BLADE 2-5/6 (NEEDLE) ×3 IMPLANT
ELECT REM PT RETURN 9FT ADLT (ELECTROSURGICAL) ×3
ELECTRODE REM PT RTRN 9FT ADLT (ELECTROSURGICAL) ×1 IMPLANT
GAUZE SPONGE 4X4 16PLY XRAY LF (GAUZE/BANDAGES/DRESSINGS) ×3 IMPLANT
GAUZE XEROFORM 1X8 LF (GAUZE/BANDAGES/DRESSINGS) IMPLANT
GLOVE BIO SURGEON STRL SZ 6 (GLOVE) ×3 IMPLANT
GLOVE BIO SURGEON STRL SZ 6.5 (GLOVE) ×2 IMPLANT
GLOVE BIO SURGEONS STRL SZ 6.5 (GLOVE) ×1
GLOVE EXAM NITRILE PF LG BLUE (GLOVE) IMPLANT
GLOVE SURG SS PI 6.0 STRL IVOR (GLOVE) ×3 IMPLANT
GLOVE SURG SS PI 7.0 STRL IVOR (GLOVE) ×3 IMPLANT
GOWN STRL REUS W/ TWL LRG LVL3 (GOWN DISPOSABLE) ×2 IMPLANT
GOWN STRL REUS W/TWL LRG LVL3 (GOWN DISPOSABLE) ×4
NEEDLE 27GAX1X1/2 (NEEDLE) ×3 IMPLANT
NEEDLE HYPO 30GX1 BEV (NEEDLE) IMPLANT
NS IRRIG 1000ML POUR BTL (IV SOLUTION) ×3 IMPLANT
PACK BASIN DAY SURGERY FS (CUSTOM PROCEDURE TRAY) ×3 IMPLANT
PAD CLEANER CAUTERY TIP 5X5 (MISCELLANEOUS)
PENCIL BUTTON HOLSTER BLD 10FT (ELECTRODE) ×3 IMPLANT
SHEET MEDIUM DRAPE 40X70 STRL (DRAPES) ×3 IMPLANT
SPONGE GAUZE 2X2 8PLY STER LF (GAUZE/BANDAGES/DRESSINGS)
SPONGE GAUZE 2X2 8PLY STRL LF (GAUZE/BANDAGES/DRESSINGS) IMPLANT
SPONGE GAUZE 4X4 12PLY STER LF (GAUZE/BANDAGES/DRESSINGS) IMPLANT
STRIP CLOSURE SKIN 1/2X4 (GAUZE/BANDAGES/DRESSINGS) IMPLANT
SUT CHROMIC 4 0 P 3 18 (SUTURE) IMPLANT
SUT MNCRL AB 4-0 PS2 18 (SUTURE) ×3 IMPLANT
SUT MON AB 5-0 P3 18 (SUTURE) ×6 IMPLANT
SUT PROLENE 6 0 P 1 18 (SUTURE) ×6 IMPLANT
SUT VICRYL 4-0 PS2 18IN ABS (SUTURE) IMPLANT
SYR BULB 3OZ (MISCELLANEOUS) ×3 IMPLANT
SYR CONTROL 10ML LL (SYRINGE) ×3 IMPLANT
TRAY DSU PREP LF (CUSTOM PROCEDURE TRAY) ×3 IMPLANT

## 2013-08-02 NOTE — Anesthesia Procedure Notes (Signed)
Procedure Name: Intubation Date/Time: 08/02/2013 10:36 AM Performed by: Lieutenant Diego Pre-anesthesia Checklist: Patient identified, Emergency Drugs available, Suction available and Patient being monitored Patient Re-evaluated:Patient Re-evaluated prior to inductionOxygen Delivery Method: Circle System Utilized Preoxygenation: Pre-oxygenation with 100% oxygen Intubation Type: IV induction Ventilation: Mask ventilation without difficulty Laryngoscope Size: Miller and 2 Grade View: Grade I Tube type: Oral Tube size: 7.0 mm Number of attempts: 1 Airway Equipment and Method: stylet and oral airway Placement Confirmation: ETT inserted through vocal cords under direct vision,  positive ETCO2 and breath sounds checked- equal and bilateral Secured at: 24 cm Tube secured with: Tape Dental Injury: Teeth and Oropharynx as per pre-operative assessment

## 2013-08-02 NOTE — Discharge Instructions (Signed)

## 2013-08-02 NOTE — Op Note (Signed)
Operative Note   DATE OF OPERATION: 4.28.2015  LOCATION: [place]- outpatient  SURGICAL DIVISION: Plastic Surgery  PREOPERATIVE DIAGNOSES:  1. Mohs resection right cheek 2. History squamous cell carcinoma right cheek 3. Open wound right cheek  POSTOPERATIVE DIAGNOSES:  same  PROCEDURE:  Adjacent tissue transfer right cheek 50 cm2  SURGEON: Irene Limbo MD MBA  ASSISTANT: none  ANESTHESIA:  General.   EBL: minimal  COMPLICATIONS: None.   INDICATIONS FOR PROCEDURE:  The patient, Dennis Adams, is a 79 y.o. male born on 22-Oct-1934, is here for treatment open wound following Mohs resection SCC right cheek. The patient is expected to receive radiation to area given size and perineural invasion tumor.   FINDINGS: Anterior based skin flap utilized for closure.  DESCRIPTION OF PROCEDURE:  The patient's operative site was marked with the patient in the preoperative area. The patient was taken to the operating room. SCDs were placed and IV antibiotics were given. The patient's operative site was prepped and draped in a sterile fashion. A time out was performed and all information was confirmed to be correct.  Local anesthetic infiltrated to perform right infraorbital nerve block and surrounding wound. Elevation of skin in subcutaneous plane completed inferior and posterior to wound onto cheek and post auricular skin. Anterior and inferior based rhomboid flap designed from inferior cheek and neck skin. Flap incised sharply and elevated. Wound irrigated and hemostasis obtained with bipolar cautery. Flap rotated into defect and skin margins trimmed to defect. Flap inset with interrupted 5-0 monocryl suture in dermis. Donor site closed primarily with 4-0 monocryl in dermis. Skin closure completed throughout with 6-0 prolene short running. Antibiotic ointment applied to all incision lines.   The patient was allowed to wake from anesthesia, extubated and taken to the recovery room in  satisfactory condition.   SPECIMENS: none  DRAINS: none  Irene Limbo, MD [facility] [phone]

## 2013-08-02 NOTE — Interval H&P Note (Signed)
History and Physical Interval Note:  08/02/2013 9:21 AM  Geanie Logan  has presented today for surgery, with the diagnosis of Open wound of cheek, complicated/Squamous cell carcinoma of other specified sites of skin/Other specified acquired deformity of head  The various methods of treatment have been discussed with the patient and family. After consideration of risks, benefits and other options for treatment, the patient has consented to  Procedure(s): ADJACENT TISSUE TRANSFER RIGHT CHEEK FULL THICKNESS SKIN GRAFT FROM RIGHT OR LEFT ARM (Right) as a surgical intervention .  The patient's history has been reviewed, patient examined, no change in status, stable for surgery.  I have reviewed the patient's chart and labs.  Questions were answered to the patient's satisfaction.     Arnoldo Hooker Ferdinando Lodge

## 2013-08-02 NOTE — Anesthesia Preprocedure Evaluation (Signed)
Anesthesia Evaluation  Patient identified by MRN, date of birth, ID band Patient awake    Reviewed: Allergy & Precautions, H&P , NPO status , Patient's Chart, lab work & pertinent test results  Airway Mallampati: II  Neck ROM: full    Dental   Pulmonary former smoker,          Cardiovascular negative cardio ROS  + dysrhythmias Atrial Fibrillation  S/p ablation for AF   Neuro/Psych    GI/Hepatic GERD-  ,  Endo/Other  Hypothyroidism   Renal/GU      Musculoskeletal  (+) Arthritis -, Rheumatoid disorders,    Abdominal   Peds  Hematology   Anesthesia Other Findings   Reproductive/Obstetrics                           Anesthesia Physical Anesthesia Plan  ASA: II  Anesthesia Plan: General   Post-op Pain Management:    Induction: Intravenous  Airway Management Planned: Oral ETT  Additional Equipment:   Intra-op Plan:   Post-operative Plan: Extubation in OR  Informed Consent: I have reviewed the patients History and Physical, chart, labs and discussed the procedure including the risks, benefits and alternatives for the proposed anesthesia with the patient or authorized representative who has indicated his/her understanding and acceptance.     Plan Discussed with: CRNA, Anesthesiologist and Surgeon  Anesthesia Plan Comments:         Anesthesia Quick Evaluation

## 2013-08-02 NOTE — Anesthesia Postprocedure Evaluation (Signed)
Anesthesia Post Note  Patient: Dennis Adams  Procedure(s) Performed: Procedure(s) (LRB): ADJACENT TISSUE TRANSFER RIGHT CHEEK  (Right)  Anesthesia type: General  Patient location: PACU  Post pain: Pain level controlled and Adequate analgesia  Post assessment: Post-op Vital signs reviewed, Patient's Cardiovascular Status Stable, Respiratory Function Stable, Patent Airway and Pain level controlled  Last Vitals:  Filed Vitals:   08/02/13 1215  BP:   Pulse: 56  Temp:   Resp: 11    Post vital signs: Reviewed and stable  Level of consciousness: awake, alert  and oriented  Complications: No apparent anesthesia complications

## 2013-08-02 NOTE — Transfer of Care (Signed)
Immediate Anesthesia Transfer of Care Note  Patient: Dennis Adams  Procedure(s) Performed: Procedure(s): ADJACENT TISSUE TRANSFER RIGHT CHEEK  (Right)  Patient Location: PACU  Anesthesia Type:General  Level of Consciousness: awake and alert   Airway & Oxygen Therapy: Patient Spontanous Breathing and Patient connected to face mask oxygen  Post-op Assessment: Report given to PACU RN and Post -op Vital signs reviewed and stable  Post vital signs: Reviewed and stable  Complications: No apparent anesthesia complications

## 2013-08-03 ENCOUNTER — Encounter (HOSPITAL_BASED_OUTPATIENT_CLINIC_OR_DEPARTMENT_OTHER): Payer: Self-pay | Admitting: Plastic Surgery

## 2013-08-10 ENCOUNTER — Encounter: Payer: Self-pay | Admitting: Radiation Oncology

## 2013-08-10 NOTE — Progress Notes (Signed)
Histology and Location of Primary Skin Cancer:  Right cheek- preauricular   Patient presented with the following signs/symptoms,   months ago: chronic numbness of right face, difficulty chewing over past year  Past/Anticipated interventions by patient's surgeon/dermatologist for current problematic lesion, if any:  07/28/13 Moh's surgery   Past skin cancers, if any:  1) Location/Histology/Intervention: left temple excision that incurred frontal n branch resection and subsequent left brow lift  2) Location/Histology/Intervention:   3) Location/Histology/Intervention:   History of Blistering sunburns, if any: tanning bed user in past  SAFETY ISSUES:  Prior radiation? no  Pacemaker/ICD? no  Possible current pregnancy? na  Is the patient on methotrexate? YES, takes 2.5 mg tabs, 6 tabs weekly on Sundays to equal 15 mg.  Current Complaints / other details:  History of over 20 skin excisions for cancer, mainly squamous cell on his trunk up to his head/neck/face, no hx melanoma Lives at [facility], [place] . Daughter is RN for Dr Onalee Hua.

## 2013-08-11 ENCOUNTER — Ambulatory Visit
Admission: RE | Admit: 2013-08-11 | Discharge: 2013-08-11 | Disposition: A | Discharge status: Home / Self Care | Payer: Medicare Other | Source: Ambulatory Visit | Attending: Radiation Oncology | Admitting: Radiation Oncology

## 2013-08-11 ENCOUNTER — Encounter: Payer: Self-pay | Admitting: Radiation Oncology

## 2013-08-11 ENCOUNTER — Ambulatory Visit: Payer: Medicare Other | Admitting: Radiation Oncology

## 2013-08-11 VITALS — BP 88/61 | HR 73 | Temp 98.5°F | Resp 18 | Ht 72.0 in | Wt 133.0 lb

## 2013-08-11 DIAGNOSIS — E039 Hypothyroidism, unspecified: Secondary | ICD-10-CM | POA: Diagnosis not present

## 2013-08-11 DIAGNOSIS — Z9089 Acquired absence of other organs: Secondary | ICD-10-CM | POA: Diagnosis not present

## 2013-08-11 DIAGNOSIS — Z87891 Personal history of nicotine dependence: Secondary | ICD-10-CM | POA: Diagnosis not present

## 2013-08-11 DIAGNOSIS — C44390 Other specified malignant neoplasm of skin of unspecified parts of face: Secondary | ICD-10-CM | POA: Insufficient documentation

## 2013-08-11 DIAGNOSIS — Z79899 Other long term (current) drug therapy: Secondary | ICD-10-CM | POA: Insufficient documentation

## 2013-08-11 DIAGNOSIS — M069 Rheumatoid arthritis, unspecified: Secondary | ICD-10-CM | POA: Diagnosis not present

## 2013-08-11 DIAGNOSIS — Z7982 Long term (current) use of aspirin: Secondary | ICD-10-CM | POA: Insufficient documentation

## 2013-08-11 DIAGNOSIS — C44320 Squamous cell carcinoma of skin of unspecified parts of face: Secondary | ICD-10-CM | POA: Insufficient documentation

## 2013-08-11 DIAGNOSIS — K219 Gastro-esophageal reflux disease without esophagitis: Secondary | ICD-10-CM | POA: Insufficient documentation

## 2013-08-11 HISTORY — DX: Iron deficiency anemia, unspecified: D50.9

## 2013-08-11 HISTORY — DX: Unspecified malignant neoplasm of skin, unspecified: C44.90

## 2013-08-11 NOTE — Progress Notes (Signed)
[facility] NEW PATIENT EVALUATION  Name: Dennis Adams MRN: [id number]  Date:   08/11/2013           DOB: 27-Jun-1934  Status: outpatient   CC: Gar Ponto, MD  Izora Ribas, MD , Dr. Thea Silversmith, Dr. 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 male who is seen today through the courtesy of Dr. Izora Ribas for consideration of postoperative radiotherapy in the management of his squamous cell carcinoma the skin arising from the preauricular region. The patient has an extensive history of multiple skin cancers, primarily treated by surgical resection. He was noted to have a crusted lesion along the right particular region which was apparently biopsied by Dr. Denna Haggard and found to represent  squamous cell carcinoma. He was referred to Dr. Izora Ribas for Mohs surgery on 07/28/2013. Dr. Harvel Quale described a 3.0 x 3.0 cm carcinoma. He underwent a two-stage resection resulting in a defect of 6.1 x 4.2 cm. The patient was seen by Dr. Irene Limbo who performed an adjacent tissue transfer from the right cheek/upper neck on 08/02/2013. On review of his pathology he was found to have invasive squamous cell carcinoma with perineural invasion involving a nerve measuring 0.125 mm in diameter. He is doing well postoperatively. 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 He also has a history of left upper facial weakness, secondary to skin cancer surgery in the past according to his daughter. Of note is that he does take methotrexate for rheumatoid arthritis.  PREVIOUS RADIATION THERAPY: No   PAST MEDICAL HISTORY:  has a past medical history of Rheumatoid arthritis(714.0); GERD (gastroesophageal reflux disease);  Hypothyroidism; Atrial flutter; Atrial fibrillation; Wears glasses; Skin cancer (07/28/13); and Iron deficiency anemia.     PAST SURGICAL HISTORY:  Past Surgical History  Procedure Laterality Date  . Cholecystectomy      open choley  . Lumbar disc surgery      x3  . Colon resection      for blockage  . Appendectomy    . Skin full thickness graft Right 08/02/2013    Procedure: ADJACENT TISSUE TRANSFER RIGHT CHEEK ;  Surgeon: Irene Limbo, MD;  Location: [facility];  Service: Plastics;  Laterality: Right;  . Mohs surgery Right 08/02/13    right cheek, history squamous cell carcinoma  . Skin surgery      > 20 skin cancer resections, no hx melanoma  . Total shoulder replacement Left   . Hip surgery Left     for fracture     FAMILY HISTORY: family history includes Alzheimer's disease in his mother and sister; Cancer in his mother; Coronary artery disease in his father and mother; Heart attack in his father. His father died of a heart attack at 3 and his mother died from complications of Alzheimer's disease at 40.   SOCIAL HISTORY:  reports that he quit smoking about 47 years ago. His smoking use included Cigarettes. He smoked 0.00 packs per day. His smokeless tobacco use includes Chew. He reports that he does not drink alcohol or use illicit drugs. Worked as a Furniture conservator/restorer. Widower for the past 2 years. Significant actinic exposure from the use of a tanning bed.   ALLERGIES:  Celecoxib   MEDICATIONS:  Current Outpatient Prescriptions  Medication Sig Dispense Refill  . antiseptic oral rinse (BIOTENE) LIQD 15 mLs by Mouth Rinse route 2 (two) times daily after a meal.      . calcium-vitamin D (OSCAL WITH D) 500-200 MG-UNIT per tablet Take 1 tablet by mouth 2 (two) times daily.      . Cholecalciferol (VITAMIN D-3) 1000 UNITS CAPS Take 1 capsule by mouth daily.      Marland Kitchen diltiazem (DILACOR XR) 240 MG 24 hr capsule Take 240 mg by mouth daily.      Marland Kitchen docusate sodium (COLACE)  100 MG capsule Take 100 mg by mouth 2 (two) times daily.      Marland Kitchen FLUoxetine (PROZAC) 20 MG capsule Take 20 mg by mouth daily.      . folic acid (FOLVITE) 518 MCG tablet Take 800 mcg by mouth daily.        Marland Kitchen HYDROcodone-acetaminophen (NORCO/VICODIN) 5-325 MG per tablet Take 1 tablet by mouth every 6 (six) hours as needed for moderate pain.      . iron polysaccharides (NU-IRON) 150 MG capsule Take 150 mg by mouth 2 (two) times daily.        Marland Kitchen leflunomide (ARAVA) 10 MG tablet Take 10 mg by mouth daily.        Marland Kitchen levothyroxine (SYNTHROID, LEVOTHROID) 50 MCG tablet Take 75 mcg by mouth daily.       . methotrexate (RHEUMATREX) 2.5 MG tablet Take 2.5 mg by mouth as directed. Caution:Chemotherapy. Protect from light.       . metoprolol (LOPRESSOR) 50 MG tablet Take 50 mg by mouth 2 (two) times daily.      . Multiple Vitamins-Minerals (CENTRUM SILVER PO) Take 1 tablet by mouth daily.        Marland Kitchen omeprazole (PRILOSEC) 20 MG capsule Take 20 mg by mouth daily.      Marland Kitchen oxyCODONE (OXY IR/ROXICODONE) 5 MG immediate release tablet Take 1 tablet (5 mg total) by mouth every 4 (four) hours as needed for severe pain.  30 tablet  0  . potassium chloride (KLOR-CON) 20 MEQ packet Take by mouth 2 (two) times daily.      . predniSONE (DELTASONE) 5 MG tablet Take 7.5 mg by mouth daily.        . Tamsulosin HCl (FLOMAX) 0.4 MG CAPS Take 0.4 mg by mouth daily.        . vitamin A 10000 UNIT capsule Take 1 capsule (10,000 Units total) by mouth daily.  30 capsule  0  . aspirin EC 81 MG tablet Take 1 tablet (81 mg total) by mouth daily.      . Calcium Carbonate (CALCIUM 500 PO) Take 2 tablets by mouth daily.        Marland Kitchen diltiazem (CARDIZEM CD) 240 MG 24 hr capsule       . Glucosamine 500 MG CAPS Take 1 capsule by mouth daily.        Marland Kitchen HYDROcodone-acetaminophen (VICODIN) 5-500 MG per tablet Take 1 tablet by mouth every 6 (six) hours as needed.        . Omega-3 Fatty Acids (FISH OIL) 1200 MG CAPS Take 2 capsules by mouth daily.        Marland Kitchen  oxycodone (OXY-IR) 5 MG capsule Take 5 mg by mouth every 4 (four) hours as needed.      . Red Yeast Rice 600 MG TABS Take 1 tablet by mouth daily.        . traZODone (  DESYREL) 100 MG tablet       . vitamin B-12 (CYANOCOBALAMIN) 1000 MCG tablet Take 1,000 mcg by mouth daily.         No current facility-administered medications for this encounter.     REVIEW OF SYSTEMS:  Pertinent items are noted in HPI.    PHYSICAL EXAM:  height is 6' (1.829 m) and weight is 133 lb (60.328 kg). His oral temperature is 98.5 F (36.9 C). His blood pressure is 88/61 and his pulse is 73. His respiration is 18.   Alert and oriented 79 year old white male appearing his stated age. On inspection of the face he has numerous actinic lesions. There is a healing graft along the right preauricular region and upper neck. No visible or palpable evidence for recurrent disease. On inspection oral cavity he has absent dentition along the right maxillary and mandibular molar regions. Neurologic examination remarkable for numbness along his right preauricular region and weakness along his forehead bilaterally. Smile is symmetrical. There is no periaricular/parotid, or cervical lymphadenopathy.   LABORATORY DATA:  Lab Results  Component Value Date   WBC 13.3* 10/01/2009   HGB 14.3 08/02/2013   HCT 42.0 08/02/2013   MCV 91.4 10/01/2009   PLT 215 10/01/2009   Lab Results  Component Value Date   NA 138 08/02/2013   K 3.6* 08/02/2013   CL 103 08/02/2013   CO2 31 10/01/2009   Lab Results  Component Value Date   ALT 13 10/01/2009   AST 21 10/01/2009   ALKPHOS 81 10/01/2009   BILITOT 0.5 10/01/2009      IMPRESSION: Squamous cell carcinoma of the skin, right preauricular region. I do recommend postoperative radiation therapy in view of his nerve involvement. Due to the proximity of the parotid lymphatic, like to obtain a MRI scan just to make sure that there is no lymphatic/nodal spread which would certainly alter our treatment  approach. In the absence of metastatic disease, I recommend delivering 6000 cGy in 30 sessions to his tumor bed per [facility] guidelines. I understand that he resides adjacent to [facility] in [place], and following his MRI scan he can be seen by Dr. Thea Silversmith of our group who can deliver his treatment closer to home. I discussed the potential acute and late toxicities of electron beam radiation therapy. His daughter, Charna Elizabeth (work # [phone]) who works for Dr. Denna Haggard,  was in attendance and understands the potential benefits and toxicities of radiation therapy. Consent will be signed when he visits Dr. Pablo Ledger for a followup visit in [place]. Lastly, he is on low-dose methotrexate, presumably for rheumatoid arthritis. He'll be asked to discontinue methotrexate during his course of radiation therapy to avoid sensitization.   PLAN: As discussed above.  I spent 40 minutes minutes face to face with the patient and more than 50% of that time was spent in counseling and/or coordination of care.

## 2013-08-11 NOTE — Progress Notes (Signed)
Please see the Nurse Progress Note in the MD Initial Consult Encounter for this patient. 

## 2013-08-11 NOTE — Addendum Note (Signed)
Encounter addended by: Rexene Edison, MD on: 08/11/2013  4:06 PM<BR>     Documentation filed: Arn Medal VN

## 2013-08-12 ENCOUNTER — Telehealth: Payer: Self-pay | Admitting: *Deleted

## 2013-08-12 NOTE — Telephone Encounter (Signed)
CALLED PATIENT'S DAUGHTER- GINA HALL TO DISCUSS TEST AND FU VISIT WITH DR. Pablo Ledger, LVM FOR A RETURN CALL

## 2013-08-16 ENCOUNTER — Telehealth: Payer: Self-pay | Admitting: *Deleted

## 2013-08-16 NOTE — Telephone Encounter (Signed)
Called patient's daughter Barnett Applebaum to inform of test and visit with Dr. Pablo Ledger in [place], lvm for a return call

## 2013-08-16 NOTE — Telephone Encounter (Signed)
CALLED PATIENT'S DAUGHTER - GINA HALL TO INFORM OF MRI FOR 08-17-13 AND HIS VISIT WITH DR. Pablo Ledger ON 08-26-13 @ 7:45 AM IN [place] SPOKE WITH PATIENT'S DAUGHTER AND SHE IS AWARE OF THESE APPTS.

## 2013-08-17 ENCOUNTER — Ambulatory Visit (HOSPITAL_COMMUNITY)
Admission: RE | Admit: 2013-08-17 | Discharge: 2013-08-17 | Disposition: A | Discharge status: Home / Self Care | Payer: Medicare Other | Source: Ambulatory Visit | Attending: Radiation Oncology | Admitting: Radiation Oncology

## 2013-08-17 DIAGNOSIS — G9389 Other specified disorders of brain: Secondary | ICD-10-CM | POA: Insufficient documentation

## 2013-08-17 DIAGNOSIS — C4490 Unspecified malignant neoplasm of skin, unspecified: Secondary | ICD-10-CM | POA: Insufficient documentation

## 2013-08-17 DIAGNOSIS — C44320 Squamous cell carcinoma of skin of unspecified parts of face: Secondary | ICD-10-CM

## 2013-08-17 MED ORDER — GADOBENATE DIMEGLUMINE 529 MG/ML IV SOLN
12.0000 mL | Freq: Once | INTRAVENOUS | Status: AC | PRN
  Administered 2013-08-17: 12 mL via INTRAVENOUS

## 2014-04-07 DIAGNOSIS — I482 Chronic atrial fibrillation: Secondary | ICD-10-CM | POA: Diagnosis not present

## 2014-04-12 DIAGNOSIS — Z79899 Other long term (current) drug therapy: Secondary | ICD-10-CM | POA: Diagnosis not present

## 2014-04-12 DIAGNOSIS — Z5181 Encounter for therapeutic drug level monitoring: Secondary | ICD-10-CM | POA: Diagnosis not present

## 2014-04-14 DIAGNOSIS — Z923 Personal history of irradiation: Secondary | ICD-10-CM | POA: Diagnosis not present

## 2014-04-14 DIAGNOSIS — C44329 Squamous cell carcinoma of skin of other parts of face: Secondary | ICD-10-CM | POA: Diagnosis not present

## 2014-04-17 DIAGNOSIS — J929 Pleural plaque without asbestos: Secondary | ICD-10-CM | POA: Diagnosis not present

## 2014-04-17 DIAGNOSIS — K118 Other diseases of salivary glands: Secondary | ICD-10-CM | POA: Diagnosis not present

## 2014-04-17 DIAGNOSIS — C44329 Squamous cell carcinoma of skin of other parts of face: Secondary | ICD-10-CM | POA: Diagnosis not present

## 2014-04-17 DIAGNOSIS — Z923 Personal history of irradiation: Secondary | ICD-10-CM | POA: Diagnosis not present

## 2014-04-17 DIAGNOSIS — J90 Pleural effusion, not elsewhere classified: Secondary | ICD-10-CM | POA: Diagnosis not present

## 2014-04-17 DIAGNOSIS — C4442 Squamous cell carcinoma of skin of scalp and neck: Secondary | ICD-10-CM | POA: Diagnosis not present

## 2014-05-02 DIAGNOSIS — R269 Unspecified abnormalities of gait and mobility: Secondary | ICD-10-CM | POA: Diagnosis not present

## 2014-05-08 DIAGNOSIS — R05 Cough: Secondary | ICD-10-CM | POA: Diagnosis not present

## 2014-05-16 DIAGNOSIS — R262 Difficulty in walking, not elsewhere classified: Secondary | ICD-10-CM | POA: Diagnosis not present

## 2014-05-17 DIAGNOSIS — R262 Difficulty in walking, not elsewhere classified: Secondary | ICD-10-CM | POA: Diagnosis not present

## 2014-05-18 DIAGNOSIS — R262 Difficulty in walking, not elsewhere classified: Secondary | ICD-10-CM | POA: Diagnosis not present

## 2014-05-19 DIAGNOSIS — R262 Difficulty in walking, not elsewhere classified: Secondary | ICD-10-CM | POA: Diagnosis not present

## 2014-05-22 DIAGNOSIS — R262 Difficulty in walking, not elsewhere classified: Secondary | ICD-10-CM | POA: Diagnosis not present

## 2014-05-23 DIAGNOSIS — R262 Difficulty in walking, not elsewhere classified: Secondary | ICD-10-CM | POA: Diagnosis not present

## 2014-05-24 DIAGNOSIS — R262 Difficulty in walking, not elsewhere classified: Secondary | ICD-10-CM | POA: Diagnosis not present

## 2014-05-25 DIAGNOSIS — R262 Difficulty in walking, not elsewhere classified: Secondary | ICD-10-CM | POA: Diagnosis not present

## 2014-05-25 DIAGNOSIS — C760 Malignant neoplasm of head, face and neck: Secondary | ICD-10-CM | POA: Diagnosis not present

## 2014-05-25 DIAGNOSIS — C44320 Squamous cell carcinoma of skin of unspecified parts of face: Secondary | ICD-10-CM | POA: Diagnosis not present

## 2014-05-25 DIAGNOSIS — D0439 Carcinoma in situ of skin of other parts of face: Secondary | ICD-10-CM | POA: Diagnosis not present

## 2014-05-26 DIAGNOSIS — R262 Difficulty in walking, not elsewhere classified: Secondary | ICD-10-CM | POA: Diagnosis not present

## 2014-05-29 DIAGNOSIS — R262 Difficulty in walking, not elsewhere classified: Secondary | ICD-10-CM | POA: Diagnosis not present

## 2014-05-29 DIAGNOSIS — J189 Pneumonia, unspecified organism: Secondary | ICD-10-CM | POA: Diagnosis not present

## 2014-05-30 DIAGNOSIS — R262 Difficulty in walking, not elsewhere classified: Secondary | ICD-10-CM | POA: Diagnosis not present

## 2014-05-31 DIAGNOSIS — R262 Difficulty in walking, not elsewhere classified: Secondary | ICD-10-CM | POA: Diagnosis not present

## 2014-06-01 DIAGNOSIS — R262 Difficulty in walking, not elsewhere classified: Secondary | ICD-10-CM | POA: Diagnosis not present

## 2014-06-02 DIAGNOSIS — I482 Chronic atrial fibrillation: Secondary | ICD-10-CM | POA: Diagnosis not present

## 2014-06-02 DIAGNOSIS — R262 Difficulty in walking, not elsewhere classified: Secondary | ICD-10-CM | POA: Diagnosis not present

## 2014-06-05 DIAGNOSIS — R262 Difficulty in walking, not elsewhere classified: Secondary | ICD-10-CM | POA: Diagnosis not present

## 2014-06-06 DIAGNOSIS — R262 Difficulty in walking, not elsewhere classified: Secondary | ICD-10-CM | POA: Diagnosis not present

## 2014-06-12 DIAGNOSIS — Z79899 Other long term (current) drug therapy: Secondary | ICD-10-CM | POA: Diagnosis not present

## 2014-06-12 DIAGNOSIS — R262 Difficulty in walking, not elsewhere classified: Secondary | ICD-10-CM | POA: Diagnosis not present

## 2014-06-12 DIAGNOSIS — Z5181 Encounter for therapeutic drug level monitoring: Secondary | ICD-10-CM | POA: Diagnosis not present

## 2014-06-14 DIAGNOSIS — R262 Difficulty in walking, not elsewhere classified: Secondary | ICD-10-CM | POA: Diagnosis not present

## 2014-06-15 DIAGNOSIS — C44320 Squamous cell carcinoma of skin of unspecified parts of face: Secondary | ICD-10-CM | POA: Diagnosis not present

## 2014-06-16 DIAGNOSIS — C801 Malignant (primary) neoplasm, unspecified: Secondary | ICD-10-CM | POA: Diagnosis not present

## 2014-06-16 DIAGNOSIS — R262 Difficulty in walking, not elsewhere classified: Secondary | ICD-10-CM | POA: Diagnosis not present

## 2014-06-16 DIAGNOSIS — C7989 Secondary malignant neoplasm of other specified sites: Secondary | ICD-10-CM | POA: Diagnosis not present

## 2014-06-16 DIAGNOSIS — R5381 Other malaise: Secondary | ICD-10-CM | POA: Diagnosis not present

## 2014-06-16 DIAGNOSIS — J90 Pleural effusion, not elsewhere classified: Secondary | ICD-10-CM | POA: Diagnosis not present

## 2014-06-19 DIAGNOSIS — R262 Difficulty in walking, not elsewhere classified: Secondary | ICD-10-CM | POA: Diagnosis not present

## 2014-06-22 DIAGNOSIS — B351 Tinea unguium: Secondary | ICD-10-CM | POA: Diagnosis not present

## 2014-06-22 DIAGNOSIS — M79674 Pain in right toe(s): Secondary | ICD-10-CM | POA: Diagnosis not present

## 2014-06-22 DIAGNOSIS — M79675 Pain in left toe(s): Secondary | ICD-10-CM | POA: Diagnosis not present

## 2014-06-22 DIAGNOSIS — M0540 Rheumatoid myopathy with rheumatoid arthritis of unspecified site: Secondary | ICD-10-CM | POA: Diagnosis not present

## 2014-07-03 DIAGNOSIS — C801 Malignant (primary) neoplasm, unspecified: Secondary | ICD-10-CM | POA: Diagnosis not present

## 2014-07-04 DIAGNOSIS — C801 Malignant (primary) neoplasm, unspecified: Secondary | ICD-10-CM | POA: Diagnosis not present

## 2014-07-05 DIAGNOSIS — C801 Malignant (primary) neoplasm, unspecified: Secondary | ICD-10-CM | POA: Diagnosis not present

## 2014-07-06 DIAGNOSIS — C801 Malignant (primary) neoplasm, unspecified: Secondary | ICD-10-CM | POA: Diagnosis not present

## 2014-07-07 DIAGNOSIS — C801 Malignant (primary) neoplasm, unspecified: Secondary | ICD-10-CM | POA: Diagnosis not present

## 2014-07-08 DIAGNOSIS — C801 Malignant (primary) neoplasm, unspecified: Secondary | ICD-10-CM | POA: Diagnosis not present

## 2014-07-09 DIAGNOSIS — C801 Malignant (primary) neoplasm, unspecified: Secondary | ICD-10-CM | POA: Diagnosis not present

## 2014-07-10 DIAGNOSIS — C801 Malignant (primary) neoplasm, unspecified: Secondary | ICD-10-CM | POA: Diagnosis not present

## 2014-07-11 DIAGNOSIS — C801 Malignant (primary) neoplasm, unspecified: Secondary | ICD-10-CM | POA: Diagnosis not present

## 2014-07-12 DIAGNOSIS — C801 Malignant (primary) neoplasm, unspecified: Secondary | ICD-10-CM | POA: Diagnosis not present

## 2014-07-13 DIAGNOSIS — C801 Malignant (primary) neoplasm, unspecified: Secondary | ICD-10-CM | POA: Diagnosis not present

## 2014-07-14 DIAGNOSIS — C801 Malignant (primary) neoplasm, unspecified: Secondary | ICD-10-CM | POA: Diagnosis not present

## 2014-07-15 DIAGNOSIS — C801 Malignant (primary) neoplasm, unspecified: Secondary | ICD-10-CM | POA: Diagnosis not present

## 2014-07-16 DIAGNOSIS — C801 Malignant (primary) neoplasm, unspecified: Secondary | ICD-10-CM | POA: Diagnosis not present

## 2014-07-17 DIAGNOSIS — C801 Malignant (primary) neoplasm, unspecified: Secondary | ICD-10-CM | POA: Diagnosis not present

## 2014-07-18 DIAGNOSIS — C801 Malignant (primary) neoplasm, unspecified: Secondary | ICD-10-CM | POA: Diagnosis not present

## 2014-07-19 DIAGNOSIS — C801 Malignant (primary) neoplasm, unspecified: Secondary | ICD-10-CM | POA: Diagnosis not present

## 2014-07-20 DIAGNOSIS — C801 Malignant (primary) neoplasm, unspecified: Secondary | ICD-10-CM | POA: Diagnosis not present

## 2014-07-21 DIAGNOSIS — C801 Malignant (primary) neoplasm, unspecified: Secondary | ICD-10-CM | POA: Diagnosis not present

## 2014-07-22 DIAGNOSIS — C801 Malignant (primary) neoplasm, unspecified: Secondary | ICD-10-CM | POA: Diagnosis not present

## 2014-07-23 DIAGNOSIS — C801 Malignant (primary) neoplasm, unspecified: Secondary | ICD-10-CM | POA: Diagnosis not present

## 2014-07-23 DIAGNOSIS — I482 Chronic atrial fibrillation: Secondary | ICD-10-CM | POA: Diagnosis not present

## 2014-07-24 DIAGNOSIS — C801 Malignant (primary) neoplasm, unspecified: Secondary | ICD-10-CM | POA: Diagnosis not present

## 2014-07-25 DIAGNOSIS — C801 Malignant (primary) neoplasm, unspecified: Secondary | ICD-10-CM | POA: Diagnosis not present

## 2014-07-26 DIAGNOSIS — C801 Malignant (primary) neoplasm, unspecified: Secondary | ICD-10-CM | POA: Diagnosis not present

## 2014-07-27 DIAGNOSIS — C801 Malignant (primary) neoplasm, unspecified: Secondary | ICD-10-CM | POA: Diagnosis not present

## 2014-07-28 DIAGNOSIS — C801 Malignant (primary) neoplasm, unspecified: Secondary | ICD-10-CM | POA: Diagnosis not present

## 2014-07-29 DIAGNOSIS — C801 Malignant (primary) neoplasm, unspecified: Secondary | ICD-10-CM | POA: Diagnosis not present

## 2014-07-30 DIAGNOSIS — C801 Malignant (primary) neoplasm, unspecified: Secondary | ICD-10-CM | POA: Diagnosis not present

## 2014-07-31 DIAGNOSIS — C801 Malignant (primary) neoplasm, unspecified: Secondary | ICD-10-CM | POA: Diagnosis not present

## 2014-08-01 DIAGNOSIS — C801 Malignant (primary) neoplasm, unspecified: Secondary | ICD-10-CM | POA: Diagnosis not present

## 2014-08-02 DIAGNOSIS — C801 Malignant (primary) neoplasm, unspecified: Secondary | ICD-10-CM | POA: Diagnosis not present

## 2014-08-03 DIAGNOSIS — C801 Malignant (primary) neoplasm, unspecified: Secondary | ICD-10-CM | POA: Diagnosis not present

## 2014-08-04 DIAGNOSIS — C801 Malignant (primary) neoplasm, unspecified: Secondary | ICD-10-CM | POA: Diagnosis not present

## 2014-08-05 DIAGNOSIS — C801 Malignant (primary) neoplasm, unspecified: Secondary | ICD-10-CM | POA: Diagnosis not present

## 2014-08-06 DIAGNOSIS — C801 Malignant (primary) neoplasm, unspecified: Secondary | ICD-10-CM | POA: Diagnosis not present

## 2014-08-07 DIAGNOSIS — C801 Malignant (primary) neoplasm, unspecified: Secondary | ICD-10-CM | POA: Diagnosis not present

## 2014-08-08 DIAGNOSIS — C801 Malignant (primary) neoplasm, unspecified: Secondary | ICD-10-CM | POA: Diagnosis not present

## 2014-08-09 DIAGNOSIS — C801 Malignant (primary) neoplasm, unspecified: Secondary | ICD-10-CM | POA: Diagnosis not present

## 2014-08-10 DIAGNOSIS — C801 Malignant (primary) neoplasm, unspecified: Secondary | ICD-10-CM | POA: Diagnosis not present

## 2014-08-11 DIAGNOSIS — C801 Malignant (primary) neoplasm, unspecified: Secondary | ICD-10-CM | POA: Diagnosis not present

## 2014-08-12 DIAGNOSIS — C801 Malignant (primary) neoplasm, unspecified: Secondary | ICD-10-CM | POA: Diagnosis not present

## 2014-08-13 DIAGNOSIS — C801 Malignant (primary) neoplasm, unspecified: Secondary | ICD-10-CM | POA: Diagnosis not present

## 2014-08-14 DIAGNOSIS — C801 Malignant (primary) neoplasm, unspecified: Secondary | ICD-10-CM | POA: Diagnosis not present

## 2014-08-15 DIAGNOSIS — C801 Malignant (primary) neoplasm, unspecified: Secondary | ICD-10-CM | POA: Diagnosis not present

## 2014-08-16 DIAGNOSIS — C801 Malignant (primary) neoplasm, unspecified: Secondary | ICD-10-CM | POA: Diagnosis not present

## 2014-08-17 DIAGNOSIS — C801 Malignant (primary) neoplasm, unspecified: Secondary | ICD-10-CM | POA: Diagnosis not present

## 2014-08-18 DIAGNOSIS — C801 Malignant (primary) neoplasm, unspecified: Secondary | ICD-10-CM | POA: Diagnosis not present

## 2014-08-19 DIAGNOSIS — C801 Malignant (primary) neoplasm, unspecified: Secondary | ICD-10-CM | POA: Diagnosis not present

## 2014-08-20 DIAGNOSIS — C801 Malignant (primary) neoplasm, unspecified: Secondary | ICD-10-CM | POA: Diagnosis not present

## 2014-08-21 DIAGNOSIS — C801 Malignant (primary) neoplasm, unspecified: Secondary | ICD-10-CM | POA: Diagnosis not present

## 2014-08-22 DIAGNOSIS — C801 Malignant (primary) neoplasm, unspecified: Secondary | ICD-10-CM | POA: Diagnosis not present

## 2014-08-23 DIAGNOSIS — C801 Malignant (primary) neoplasm, unspecified: Secondary | ICD-10-CM | POA: Diagnosis not present

## 2014-08-24 DIAGNOSIS — C801 Malignant (primary) neoplasm, unspecified: Secondary | ICD-10-CM | POA: Diagnosis not present

## 2014-08-25 DIAGNOSIS — C801 Malignant (primary) neoplasm, unspecified: Secondary | ICD-10-CM | POA: Diagnosis not present

## 2014-08-26 DIAGNOSIS — C801 Malignant (primary) neoplasm, unspecified: Secondary | ICD-10-CM | POA: Diagnosis not present

## 2014-08-27 DIAGNOSIS — C801 Malignant (primary) neoplasm, unspecified: Secondary | ICD-10-CM | POA: Diagnosis not present

## 2014-08-28 DIAGNOSIS — C801 Malignant (primary) neoplasm, unspecified: Secondary | ICD-10-CM | POA: Diagnosis not present

## 2014-08-29 DIAGNOSIS — C801 Malignant (primary) neoplasm, unspecified: Secondary | ICD-10-CM | POA: Diagnosis not present

## 2014-08-30 DIAGNOSIS — C801 Malignant (primary) neoplasm, unspecified: Secondary | ICD-10-CM | POA: Diagnosis not present

## 2014-08-31 DIAGNOSIS — C801 Malignant (primary) neoplasm, unspecified: Secondary | ICD-10-CM | POA: Diagnosis not present

## 2014-09-01 DIAGNOSIS — C801 Malignant (primary) neoplasm, unspecified: Secondary | ICD-10-CM | POA: Diagnosis not present

## 2014-09-02 DIAGNOSIS — C801 Malignant (primary) neoplasm, unspecified: Secondary | ICD-10-CM | POA: Diagnosis not present

## 2014-09-03 DIAGNOSIS — C801 Malignant (primary) neoplasm, unspecified: Secondary | ICD-10-CM | POA: Diagnosis not present

## 2014-09-04 DIAGNOSIS — C801 Malignant (primary) neoplasm, unspecified: Secondary | ICD-10-CM | POA: Diagnosis not present

## 2014-09-05 DIAGNOSIS — C801 Malignant (primary) neoplasm, unspecified: Secondary | ICD-10-CM | POA: Diagnosis not present

## 2014-09-06 DIAGNOSIS — C801 Malignant (primary) neoplasm, unspecified: Secondary | ICD-10-CM | POA: Diagnosis not present

## 2014-09-07 DIAGNOSIS — C801 Malignant (primary) neoplasm, unspecified: Secondary | ICD-10-CM | POA: Diagnosis not present

## 2014-09-08 DIAGNOSIS — C801 Malignant (primary) neoplasm, unspecified: Secondary | ICD-10-CM | POA: Diagnosis not present

## 2014-09-09 DIAGNOSIS — C801 Malignant (primary) neoplasm, unspecified: Secondary | ICD-10-CM | POA: Diagnosis not present

## 2014-09-10 DIAGNOSIS — C801 Malignant (primary) neoplasm, unspecified: Secondary | ICD-10-CM | POA: Diagnosis not present

## 2014-09-11 DIAGNOSIS — C801 Malignant (primary) neoplasm, unspecified: Secondary | ICD-10-CM | POA: Diagnosis not present

## 2014-09-12 DIAGNOSIS — C801 Malignant (primary) neoplasm, unspecified: Secondary | ICD-10-CM | POA: Diagnosis not present

## 2014-09-13 DIAGNOSIS — C801 Malignant (primary) neoplasm, unspecified: Secondary | ICD-10-CM | POA: Diagnosis not present

## 2014-09-14 DIAGNOSIS — C801 Malignant (primary) neoplasm, unspecified: Secondary | ICD-10-CM | POA: Diagnosis not present

## 2014-09-15 DIAGNOSIS — C801 Malignant (primary) neoplasm, unspecified: Secondary | ICD-10-CM | POA: Diagnosis not present

## 2014-09-15 DIAGNOSIS — I482 Chronic atrial fibrillation: Secondary | ICD-10-CM | POA: Diagnosis not present

## 2014-09-16 DIAGNOSIS — C801 Malignant (primary) neoplasm, unspecified: Secondary | ICD-10-CM | POA: Diagnosis not present

## 2014-09-17 DIAGNOSIS — C801 Malignant (primary) neoplasm, unspecified: Secondary | ICD-10-CM | POA: Diagnosis not present

## 2014-09-18 DIAGNOSIS — C801 Malignant (primary) neoplasm, unspecified: Secondary | ICD-10-CM | POA: Diagnosis not present

## 2014-09-19 DIAGNOSIS — C801 Malignant (primary) neoplasm, unspecified: Secondary | ICD-10-CM | POA: Diagnosis not present

## 2014-09-20 DIAGNOSIS — C801 Malignant (primary) neoplasm, unspecified: Secondary | ICD-10-CM | POA: Diagnosis not present

## 2014-09-21 DIAGNOSIS — I70203 Unspecified atherosclerosis of native arteries of extremities, bilateral legs: Secondary | ICD-10-CM | POA: Diagnosis not present

## 2014-09-21 DIAGNOSIS — C801 Malignant (primary) neoplasm, unspecified: Secondary | ICD-10-CM | POA: Diagnosis not present

## 2014-09-21 DIAGNOSIS — M79674 Pain in right toe(s): Secondary | ICD-10-CM | POA: Diagnosis not present

## 2014-09-21 DIAGNOSIS — B351 Tinea unguium: Secondary | ICD-10-CM | POA: Diagnosis not present

## 2014-09-21 DIAGNOSIS — M79675 Pain in left toe(s): Secondary | ICD-10-CM | POA: Diagnosis not present

## 2014-09-22 DIAGNOSIS — C801 Malignant (primary) neoplasm, unspecified: Secondary | ICD-10-CM | POA: Diagnosis not present

## 2014-09-23 DIAGNOSIS — C801 Malignant (primary) neoplasm, unspecified: Secondary | ICD-10-CM | POA: Diagnosis not present

## 2014-09-24 DIAGNOSIS — C801 Malignant (primary) neoplasm, unspecified: Secondary | ICD-10-CM | POA: Diagnosis not present

## 2014-09-25 DIAGNOSIS — C801 Malignant (primary) neoplasm, unspecified: Secondary | ICD-10-CM | POA: Diagnosis not present

## 2014-09-26 DIAGNOSIS — C801 Malignant (primary) neoplasm, unspecified: Secondary | ICD-10-CM | POA: Diagnosis not present

## 2014-09-27 DIAGNOSIS — C801 Malignant (primary) neoplasm, unspecified: Secondary | ICD-10-CM | POA: Diagnosis not present

## 2014-09-28 DIAGNOSIS — C801 Malignant (primary) neoplasm, unspecified: Secondary | ICD-10-CM | POA: Diagnosis not present

## 2014-09-29 DIAGNOSIS — C801 Malignant (primary) neoplasm, unspecified: Secondary | ICD-10-CM | POA: Diagnosis not present

## 2014-10-06 DEATH — deceased
